# Patient Record
Sex: Female | Born: 1980 | Race: Asian | Hispanic: No | Marital: Married | Smoking: Never smoker
Health system: Southern US, Community
[De-identification: ages and names within clinical notes are randomized; demographics above are authoritative.]

## PROBLEM LIST (undated history)

## (undated) DIAGNOSIS — I729 Aneurysm of unspecified site: Secondary | ICD-10-CM

## (undated) DIAGNOSIS — E785 Hyperlipidemia, unspecified: Secondary | ICD-10-CM

## (undated) DIAGNOSIS — I10 Essential (primary) hypertension: Secondary | ICD-10-CM

## (undated) HISTORY — DX: Hyperlipidemia, unspecified: E78.5

## (undated) HISTORY — DX: Essential (primary) hypertension: I10

---

## 2017-01-07 LAB — HM PAP SMEAR: HM Pap smear: NEGATIVE

## 2019-02-01 ENCOUNTER — Encounter: Payer: Self-pay | Admitting: Physician Assistant

## 2019-02-01 ENCOUNTER — Ambulatory Visit (INDEPENDENT_AMBULATORY_CARE_PROVIDER_SITE_OTHER): Payer: Commercial Managed Care - PPO | Admitting: Physician Assistant

## 2019-02-01 VITALS — BP 130/80 | HR 96 | Temp 97.7°F | Ht 61.0 in | Wt 166.0 lb

## 2019-02-01 DIAGNOSIS — Z3169 Encounter for other general counseling and advice on procreation: Secondary | ICD-10-CM | POA: Diagnosis not present

## 2019-02-01 DIAGNOSIS — E785 Hyperlipidemia, unspecified: Secondary | ICD-10-CM | POA: Insufficient documentation

## 2019-02-01 DIAGNOSIS — E1169 Type 2 diabetes mellitus with other specified complication: Secondary | ICD-10-CM | POA: Insufficient documentation

## 2019-02-01 DIAGNOSIS — I152 Hypertension secondary to endocrine disorders: Secondary | ICD-10-CM | POA: Insufficient documentation

## 2019-02-01 DIAGNOSIS — I10 Essential (primary) hypertension: Secondary | ICD-10-CM | POA: Insufficient documentation

## 2019-02-01 MED ORDER — LABETALOL HCL 100 MG PO TABS: 100.0000 mg | ORAL_TABLET | Freq: Two times a day (BID) | ORAL | 0 refills | Status: DC

## 2019-02-01 NOTE — Progress Notes (Signed)
Patient: Anna Ortega, Female    DOB: [birth year], 38 y.o.   MRN: [id number] Visit Date: 02/01/2019  Today's Provider: Trinna Post, PA-C   Chief Complaint  Patient presents with  . Establish Care  . Virtual Visit   Subjective:   .  Establish Care: Anna Ortega is a 38 y.o. female who presents today to establish care.  Patient came to [place] on October 2019 from the [place], [place]. 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 She married her husband who lives here in August 2018. She does not have any children currently but reports she is planning on getting pregnant.   Patient also has a history of HTN and Hyperlipidemia takes Amlodipine 5 mg QD, coreg 25 mg BID, and norvasc 5 mg daily. She also takes Lipitor 40 mg daily for reported high triglycerides.   Patient reports that she had a pap done in the [place] 12/2016-Reports that it was normal and no HPV was performed. She will be able to send this through mychart.  Influenza Vaccine:07/30/2018 in the [place]. Got Tdap and MMR when coming to this country.  Does not drink, does not smoke. Not using drugs.  Was laid off from job due to coronavirus.  -----------------------------------------------------------------   Review of Systems  HENT: Negative.   Eyes: Negative for visual disturbance.  Cardiovascular: Negative.  Negative for chest pain, palpitations and leg swelling.  Neurological: Negative for headaches.    Social History      She  reports that she has never smoked. She has never used smokeless tobacco. She reports that she does not drink alcohol or use drugs.       Social History   Socioeconomic History  . Marital status: Married    Spouse name: Not on file  . Number of children: Not on file  . Years of education: Not on file  . Highest education level: Not on file  Occupational History  . Not on file  Social Needs  . Financial resource strain: Not on file  . Food insecurity:    Worry: Not  on file    Inability: Not on file  . Transportation needs:    Medical: Not on file    Non-medical: Not on file  Tobacco Use  . Smoking status: Never Smoker  . Smokeless tobacco: Never Used  Substance and Sexual Activity  . Alcohol use: Never    Frequency: Never  . Drug use: Never  . Sexual activity: Yes  Lifestyle  . Physical activity:    Days per week: Not on file    Minutes per session: Not on file  . Stress: Not on file  Relationships  . Social connections:    Talks on phone: Not on file    Gets together: Not on file    Attends religious service: Not on file    Active member of club or organization: Not on file    Attends meetings of clubs or organizations: Not on file    Relationship status: Not on file  Other Topics Concern  . Not on file  Social History Narrative  . Not on file    Past Medical History:  Diagnosis Date  . Hyperlipidemia   . Hypertension      Patient Active Problem List   Diagnosis Date Noted  . Hypertension 02/01/2019  . Hyperlipidemia 02/01/2019    History reviewed. No pertinent surgical history.  Family History        Family Status  Relation Name Status  .  Mother  Alive  . Father  Alive        Her family history includes Glaucoma in her mother; Hypertension in her father and mother; Thyroid disease in her father.      No Known Allergies   Current Outpatient Medications:  .  labetalol (NORMODYNE) 100 MG tablet, Take 1 tablet (100 mg total) by mouth 2 (two) times daily., Disp: 180 tablet, Rfl: 0   Patient Care Team: Paulene Floor as PCP - General (Physician Assistant)    Objective:    Vitals: BP 130/80 (BP Location: Right Arm, Patient Position: Standing, Cuff Size: Normal) Comment: 147/89 with automatic machine  Pulse 96   Temp 97.7 F (36.5 C) (Oral)   Wt 166 lb (75.3 kg)   LMP 01/08/2019    Vitals:   02/01/19 0914  BP: 130/80  Pulse: 96  Temp: 97.7 F (36.5 C)  TempSrc: Oral  Weight: 166 lb (75.3 kg)      Physical Exam Constitutional:      Appearance: Normal appearance.  Pulmonary:     Effort: Pulmonary effort is normal. No respiratory distress.  Neurological:     Mental Status: She is alert.  Psychiatric:        Mood and Affect: Mood normal.        Behavior: Behavior normal.      Depression Screen PHQ 2/9 Scores 02/01/2019  PHQ - 2 Score 0       Assessment & Plan:     Routine Health Maintenance and Physical Exam  Exercise Activities and Dietary recommendations Goals   None      There is no immunization history on file for this patient.  Health Maintenance  Topic Date Due  . HIV Screening  09/03/1994  . TETANUS/TDAP  09/03/1998  . PAP SMEAR-Modifier  09/03/2000  . INFLUENZA VACCINE  06/07/2018     Discussed health benefits of physical activity, and encouraged her to engage in regular exercise appropriate for her age and condition.    1. Hypertension, unspecified type  Will switch blood pressure medications to labetalol as patient is wanting to become pregnant. Will titrate as needed, she will check her BP ~ 3X/wk and we will see her back in one month.   - labetalol (NORMODYNE) 100 MG tablet; Take 1 tablet (100 mg total) by mouth 2 (two) times daily.  Dispense: 180 tablet; Refill: 0  2. Hyperlipidemia, unspecified hyperlipidemia type  We will discontinue her lipitor as it is not safe during pregnancy. She is quite young to be on this medication especially for only high triglycerides. Will get labwork at follow up.  3. Pre-conception counseling  She is not smoking or using alcohol. Discussed using daily prenatal vitamin.   The entirety of the information documented in the History of Present Illness, Review of Systems and Physical Exam were personally obtained by me. Portions of this information were initially documented by Anna Ortega, CMA and reviewed by me for thoroughness and accuracy.     -------------------------------------------------------------------- Nikki Dom Pollak,acting as a scribe for Trinna Post, PA-C.,have documented all relevant documentation on the behalf of Trinna Post, PA-C,as directed by  Trinna Post, PA-C while in the presence of Trinna Post, PA-C.   Trinna Post, PA-C  [facility]

## 2019-02-01 NOTE — Patient Instructions (Signed)

## 2019-03-05 ENCOUNTER — Other Ambulatory Visit: Payer: Self-pay

## 2019-03-05 ENCOUNTER — Encounter: Payer: Self-pay | Admitting: Physician Assistant

## 2019-03-05 ENCOUNTER — Ambulatory Visit: Payer: Commercial Managed Care - PPO | Admitting: Physician Assistant

## 2019-03-05 VITALS — BP 145/88 | HR 95 | Temp 98.0°F | Resp 16 | Wt 169.0 lb

## 2019-03-05 DIAGNOSIS — Z114 Encounter for screening for human immunodeficiency virus [HIV]: Secondary | ICD-10-CM

## 2019-03-05 DIAGNOSIS — Z130 Encounter for screening for diseases of the blood and blood-forming organs and certain disorders involving the immune mechanism: Secondary | ICD-10-CM | POA: Diagnosis not present

## 2019-03-05 DIAGNOSIS — E785 Hyperlipidemia, unspecified: Secondary | ICD-10-CM

## 2019-03-05 DIAGNOSIS — I10 Essential (primary) hypertension: Secondary | ICD-10-CM

## 2019-03-05 DIAGNOSIS — Z3169 Encounter for other general counseling and advice on procreation: Secondary | ICD-10-CM

## 2019-03-05 NOTE — Progress Notes (Signed)
Patient: Anna Ortega Female    DOB: February 06, 1981   38 y.o.   MRN: [id number] Visit Date: 03/06/2019  Today's Provider: Trey SailorsAdriana M Pollak, PA-C   Chief Complaint  Patient presents with  . Follow-up  . Hypertension   Subjective:     HPI   Hypertension, unspecified type From 02/01/2019-switched blood pressure medications to labetalol 100 mg BID from amlodipine 5 mg daily, losartan 50 mg daily and coreg 25 mg BID as patient is wanting to become pregnant. Reports her blood pressures at home are 120's/80s.  BP Readings from Last 3 Encounters:  03/05/19 (!) 145/88  02/01/19 130/80     Hyperlipidemia, unspecified hyperlipidemia type From 02/01/2019-discontinued her lipitor as it is not safe during pregnancy. Will get labwork at follow up.  Lipid Panel     Component Value Date/Time   CHOL 295 (H) 03/05/2019 1602   TRIG 198 (H) 03/05/2019 1602   HDL 53 03/05/2019 1602   CHOLHDL 5.6 (H) 03/05/2019 1602   LDLCALC 202 (H) 03/05/2019 1602   Trying to Conceive: Patient reports she has been trying to conceive with current partner for six months and has thus far been unsuccessful. Had tried previously but not for continuous time. Partner reports he has low sperm count. Patient has never been pregnant. She is [age] and will be 38 in October 2020.    No Known Allergies   Current Outpatient Medications:  .  labetalol (NORMODYNE) 100 MG tablet, Take 1 tablet (100 mg total) by mouth 2 (two) times daily., Disp: 180 tablet, Rfl: 0  Review of Systems  Constitutional: Negative for appetite change, chills, fatigue and fever.  Respiratory: Negative for chest tightness and shortness of breath.   Cardiovascular: Negative for chest pain and palpitations.  Gastrointestinal: Negative for abdominal pain, nausea and vomiting.  Neurological: Negative for dizziness and weakness.    Social History   Tobacco Use  . Smoking status: Never Smoker  . Smokeless tobacco: Never Used  Substance  Use Topics  . Alcohol use: Never    Frequency: Never      Objective:   BP (!) 145/88 (BP Location: Right Arm, Cuff Size: Large)   Pulse 95   Temp 98 F (36.7 C) (Oral)   Resp 16   Wt 169 lb (76.7 kg)   SpO2 98%   BMI 31.93 kg/m  Vitals:   03/05/19 1514 03/05/19 1530  BP: (!) 158/102 (!) 145/88  Pulse: 95   Resp: 16   Temp: 98 F (36.7 C)   TempSrc: Oral   SpO2: 98%   Weight: 169 lb (76.7 kg)      Physical Exam Constitutional:      Appearance: Normal appearance.  Cardiovascular:     Rate and Rhythm: Normal rate and regular rhythm.     Heart sounds: Normal heart sounds.  Pulmonary:     Effort: Pulmonary effort is normal.     Breath sounds: Normal breath sounds.  Skin:    General: Skin is warm and dry.  Neurological:     Mental Status: She is alert. Mental status is at baseline.  Psychiatric:        Mood and Affect: Mood normal.        Behavior: Behavior normal.         Assessment & Plan    1. Essential hypertension  BP lower on recheck, though still high in clinic. Patient reports her Bps run very normally at home, will keep labetalol at  100 mg BID and she will continue to check BP and call back if any increasing trend >140/90. F/u in 6 months. - Comprehensive Metabolic Panel (CMET) - Lipid Profile - TSH  2. 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 Likely will need to go back on statin in future but as she is TTC, will hold for now.  3. Screening for deficiency anemia  - CBC with Differential  4. Encounter for screening for HIV  - HIV antibody (with reflex)  5. Pre-conception counseling  Patient reports actively trying to get pregnant x 6 months without success. As she is 19, I have offered OBGYN referral as they will likely intervene sooner with fertility options. Patient declines at this point, would like to try for 6 months longer. Let her know she can call at any point and I will place  referral.   The entirety of the information documented in the History of Present Illness, Review of Systems and Physical Exam were personally obtained by me. Portions of this information were initially documented by April M. Hyacinth Meeker, CMA and reviewed by me for thoroughness and accuracy.   F/u 6 mo HTN.       Trey Sailors, PA-C  [facility]

## 2019-03-05 NOTE — Patient Instructions (Signed)
First Trimester of Pregnancy  The first trimester of pregnancy is from week 1 until the end of week 13 (months 1 through 3). During this time, your baby will begin to develop inside you. At 6-8 weeks, the eyes and face are formed, and the heartbeat can be seen on ultrasound. At the end of 12 weeks, all the baby's organs are formed. Prenatal care is all the medical care you receive before the birth of your baby. Make sure you get good prenatal care and follow all of your doctor's instructions. Follow these instructions at home: Medicines  Take over-the-counter and prescription medicines only as told by your doctor. Some medicines are safe and some medicines are not safe during pregnancy.  Take a prenatal vitamin that contains at least 600 micrograms (mcg) of folic acid.  If you have trouble pooping (constipation), take medicine that will make your stool soft (stool softener) if your doctor approves. Eating and drinking   Eat regular, healthy meals.  Your doctor will tell you the amount of weight gain that is right for you.  Avoid raw meat and uncooked cheese.  If you feel sick to your stomach (nauseous) or throw up (vomit): ? Eat 4 or 5 small meals a day instead of 3 large meals. ? Try eating a few soda crackers. ? Drink liquids between meals instead of during meals.  To prevent constipation: ? Eat foods that are high in fiber, like fresh fruits and vegetables, whole grains, and beans. ? Drink enough fluids to keep your pee (urine) clear or pale yellow. Activity  Exercise only as told by your doctor. Stop exercising if you have cramps or pain in your lower belly (abdomen) or low back.  Do not exercise if it is too hot, too humid, or if you are in a place of great height (high altitude).  Try to avoid standing for long periods of time. Move your legs often if you must stand in one place for a long time.  Avoid heavy lifting.  Wear low-heeled shoes. Sit and stand up straight.   You can have sex unless your doctor tells you not to. Relieving pain and discomfort  Wear a good support bra if your breasts are sore.  Take warm water baths (sitz baths) to soothe pain or discomfort caused by hemorrhoids. Use hemorrhoid cream if your doctor says it is okay.  Rest with your legs raised if you have leg cramps or low back pain.  If you have puffy, bulging veins (varicose veins) in your legs: ? Wear support hose or compression stockings as told by your doctor. ? Raise (elevate) your feet for 15 minutes, 3-4 times a day. ? Limit salt in your food. Prenatal care  Schedule your prenatal visits by the twelfth week of pregnancy.  Write down your questions. Take them to your prenatal visits.  Keep all your prenatal visits as told by your doctor. This is important. Safety  Wear your seat belt at all times when driving.  Make a list of emergency phone numbers. The list should include numbers for family, friends, the hospital, and police and fire departments. General instructions  Ask your doctor for a referral to a local prenatal class. Begin classes no later than at the start of month 6 of your pregnancy.  Ask for help if you need counseling or if you need help with nutrition. Your doctor can give you advice or tell you where to go for help.  Do not use hot tubs, steam   rooms, or saunas.  Do not douche or use tampons or scented sanitary pads.  Do not cross your legs for long periods of time.  Avoid all herbs and alcohol. Avoid drugs that are not approved by your doctor.  Do not use any tobacco products, including cigarettes, chewing tobacco, and electronic cigarettes. If you need help quitting, ask your doctor. You may get counseling or other support to help you quit.  Avoid cat litter boxes and soil used by cats. These carry germs that can cause birth defects in the baby and can cause a loss of your baby (miscarriage) or stillbirth.  Visit your dentist. At home,  brush your teeth with a soft toothbrush. Be gentle when you floss. Contact a doctor if:  You are dizzy.  You have mild cramps or pressure in your lower belly.  You have a nagging pain in your belly area.  You continue to feel sick to your stomach, you throw up, or you have watery poop (diarrhea).  You have a bad smelling fluid coming from your vagina.  You have pain when you pee (urinate).  You have increased puffiness (swelling) in your face, hands, legs, or ankles. Get help right away if:  You have a fever.  You are leaking fluid from your vagina.  You have spotting or bleeding from your vagina.  You have very bad belly cramping or pain.  You gain or lose weight rapidly.  You throw up blood. It may look like coffee grounds.  You are around people who have German measles, fifth disease, or chickenpox.  You have a very bad headache.  You have shortness of breath.  You have any kind of trauma, such as from a fall or a car accident. Summary  The first trimester of pregnancy is from week 1 until the end of week 13 (months 1 through 3).  To take care of yourself and your unborn baby, you will need to eat healthy meals, take medicines only if your doctor tells you to do so, and do activities that are safe for you and your baby.  Keep all follow-up visits as told by your doctor. This is important as your doctor will have to ensure that your baby is healthy and growing well. This information is not intended to replace advice given to you by your health care provider. Make sure you discuss any questions you have with your health care provider. Document Released: 04/11/2008 Document Revised: 11/01/2016 Document Reviewed: 11/01/2016 Elsevier Interactive Patient Education  2019 Elsevier Inc.  

## 2019-03-06 ENCOUNTER — Telehealth: Payer: Self-pay

## 2019-03-06 LAB — COMPREHENSIVE METABOLIC PANEL
ALT: 20 IU/L (ref 0–32)
AST: 15 IU/L (ref 0–40)
Albumin/Globulin Ratio: 1.7 (ref 1.2–2.2)
Albumin: 5 g/dL — ABNORMAL HIGH (ref 3.8–4.8)
Alkaline Phosphatase: 75 IU/L (ref 39–117)
BUN/Creatinine Ratio: 11 (ref 9–23)
BUN: 10 mg/dL (ref 6–20)
Bilirubin Total: 0.7 mg/dL (ref 0.0–1.2)
CO2: 21 mmol/L (ref 20–29)
Calcium: 10.2 mg/dL (ref 8.7–10.2)
Chloride: 100 mmol/L (ref 96–106)
Creatinine, Ser: 0.91 mg/dL (ref 0.57–1.00)
GFR calc Af Amer: 92 mL/min/{1.73_m2} (ref >59)
GFR calc non Af Amer: 80 mL/min/{1.73_m2} (ref >59)
Globulin, Total: 3 g/dL (ref 1.5–4.5)
Glucose: 91 mg/dL (ref 65–99)
Potassium: 4.1 mmol/L (ref 3.5–5.2)
Sodium: 137 mmol/L (ref 134–144)
Total Protein: 8 g/dL (ref 6.0–8.5)

## 2019-03-06 LAB — CBC WITH DIFFERENTIAL/PLATELET
Basophils Absolute: 0.1 10*3/uL (ref 0.0–0.2)
Basos: 1 %
EOS (ABSOLUTE): 0.2 10*3/uL (ref 0.0–0.4)
Eos: 2 %
Hematocrit: 40.7 % (ref 34.0–46.6)
Hemoglobin: 13.8 g/dL (ref 11.1–15.9)
Immature Grans (Abs): 0 10*3/uL (ref 0.0–0.1)
Immature Granulocytes: 0 %
Lymphocytes Absolute: 3.9 10*3/uL — ABNORMAL HIGH (ref 0.7–3.1)
Lymphs: 38 %
MCH: 29.5 pg (ref 26.6–33.0)
MCHC: 33.9 g/dL (ref 31.5–35.7)
MCV: 87 fL (ref 79–97)
Monocytes Absolute: 1 10*3/uL — ABNORMAL HIGH (ref 0.1–0.9)
Monocytes: 10 %
Neutrophils Absolute: 5.1 10*3/uL (ref 1.4–7.0)
Neutrophils: 49 %
Platelets: 341 10*3/uL (ref 150–450)
RBC: 4.68 x10E6/uL (ref 3.77–5.28)
RDW: 12.8 % (ref 11.7–15.4)
WBC: 10.3 10*3/uL (ref 3.4–10.8)

## 2019-03-06 LAB — LIPID PANEL
Chol/HDL Ratio: 5.6 ratio — ABNORMAL HIGH (ref 0.0–4.4)
Cholesterol, Total: 295 mg/dL — ABNORMAL HIGH (ref 100–199)
HDL: 53 mg/dL (ref >39)
LDL Calculated: 202 mg/dL — ABNORMAL HIGH (ref 0–99)
Triglycerides: 198 mg/dL — ABNORMAL HIGH (ref 0–149)
VLDL Cholesterol Cal: 40 mg/dL (ref 5–40)

## 2019-03-06 LAB — HIV ANTIBODY (ROUTINE TESTING W REFLEX): HIV Screen 4th Generation wRfx: NONREACTIVE

## 2019-03-06 LAB — TSH: TSH: 3.26 u[IU]/mL (ref 0.450–4.500)

## 2019-03-06 NOTE — Telephone Encounter (Signed)
-----   Message from Trey Sailors, [place] sent at 03/06/2019 12:37 PM EDT ----- Cholesterol high, it's likely genetic. She will probably have to go back on her cholesterol medication at some point but definitely not now while she is trying to get pregnant. Remaining labwork normal, no sign of diabetes.

## 2019-03-06 NOTE — Telephone Encounter (Signed)
Patient notified of results.

## 2019-04-25 ENCOUNTER — Other Ambulatory Visit: Payer: Self-pay | Admitting: Physician Assistant

## 2019-04-25 DIAGNOSIS — I10 Essential (primary) hypertension: Secondary | ICD-10-CM

## 2019-07-17 ENCOUNTER — Ambulatory Visit: Payer: Commercial Managed Care - PPO | Admitting: Physician Assistant

## 2019-07-17 ENCOUNTER — Other Ambulatory Visit: Payer: Self-pay

## 2019-07-17 ENCOUNTER — Encounter: Payer: Self-pay | Admitting: Physician Assistant

## 2019-07-17 VITALS — BP 140/82 | HR 80 | Temp 98.2°F | Resp 16 | Wt 167.0 lb

## 2019-07-17 DIAGNOSIS — I10 Essential (primary) hypertension: Secondary | ICD-10-CM

## 2019-07-17 MED ORDER — LABETALOL HCL 200 MG PO TABS: 200.0000 mg | ORAL_TABLET | Freq: Two times a day (BID) | ORAL | 0 refills | Status: DC

## 2019-07-17 NOTE — Progress Notes (Signed)
Patient: Anna Ortega Female    DOB: Jan 19, 1981   38 y.o.   MRN: [id number] Visit Date: 07/18/2019  Today's Provider: Trey SailorsAdriana M Pollak, PA-C   Chief Complaint  Patient presents with  . Hypertension   Subjective:     HPI    Hypertension, follow-up:  BP Readings from Last 3 Encounters:  07/17/19 140/82  03/05/19 (!) 145/88  02/01/19 130/80    She was last seen for hypertension 5 months ago.  BP at that visit was 145/88. Management since that visit includes; no changes. Advised to checked bp at home.She reports good compliance with treatment. She is not having side effects.  She is not exercising. She is not adherent to low salt diet.   Outside blood pressures are 180/100 on occasion and mostly 140's-150's over 80's-90's. She does feel some occasional dizziness when standing up and has had some fatigue. She is not actively preventing pregnancy.  She is experiencing none.  Patient denies chest pain, chest pressure/discomfort, claudication, dyspnea, exertional chest pressure/discomfort, fatigue, irregular heart beat, lower extremity edema, near-syncope, orthopnea, palpitations, paroxysmal nocturnal dyspnea, syncope and tachypnea.   Cardiovascular risk factors include hypertension.  Use of agents associated with hypertension: none.   Wt Readings from Last 3 Encounters:  07/17/19 167 lb (75.8 kg)  03/05/19 169 lb (76.7 kg)  02/01/19 166 lb (75.3 kg)    ------------------------------------------------------------------------    No Known Allergies   Current Outpatient Medications:  .  labetalol (NORMODYNE) 200 MG tablet, Take 1 tablet (200 mg total) by mouth 2 (two) times daily., Disp: 180 tablet, Rfl: 0  Review of Systems  Constitutional: Negative for appetite change, chills, fatigue and fever.  Respiratory: Negative for chest tightness and shortness of breath.   Cardiovascular: Negative for chest pain and palpitations.  Gastrointestinal: Negative for  abdominal pain, nausea and vomiting.  Neurological: Positive for dizziness (when standing). Negative for weakness.    Social History   Tobacco Use  . Smoking status: Never Smoker  . Smokeless tobacco: Never Used  Substance Use Topics  . Alcohol use: Never    Frequency: Never      Objective:   BP 140/82 (BP Location: Left Arm, Cuff Size: Large)   Pulse 80   Temp 98.2 F (36.8 C) (Temporal)   Resp 16   Wt 167 lb (75.8 kg)   SpO2 99% Comment: room air  BMI 31.55 kg/m  Vitals:   07/17/19 1521 07/17/19 1525 07/17/19 1532 07/17/19 1533  BP: (!) 150/100 (!) 142/90 139/87 140/82  Pulse: 80     Resp: 16     Temp: 98.2 F (36.8 C)     TempSrc: Temporal     SpO2: 99%     Weight: 167 lb (75.8 kg)     Body mass index is 31.55 kg/m.   Physical Exam Constitutional:      Appearance: Normal appearance.  Cardiovascular:     Rate and Rhythm: Normal rate and regular rhythm.     Heart sounds: Normal heart sounds.  Pulmonary:     Effort: Pulmonary effort is normal.     Breath sounds: Normal breath sounds.  Skin:    General: Skin is warm and dry.  Neurological:     Mental Status: She is alert and oriented to person, place, and time. Mental status is at baseline.  Psychiatric:        Mood and Affect: Mood normal.        Behavior: Behavior normal.  No results found for any visits on 07/17/19.     Assessment & Plan    1. Hypertension, unspecified type   We will increase medication as below. Follow up one month.  - labetalol (NORMODYNE) 200 MG tablet; Take 1 tablet (200 mg total) by mouth 2 (two) times daily.  Dispense: 180 tablet; Refill: 0  The entirety of the information documented in the History of Present Illness, Review of Systems and Physical Exam were personally obtained by me. Portions of this information were initially documented by Ashley Royalty, CMA and reviewed by me for thoroughness and accuracy.   F/u 1 month    Trinna Post, PA-C  [facility]

## 2019-07-17 NOTE — Patient Instructions (Signed)

## 2019-08-14 ENCOUNTER — Telehealth: Payer: Self-pay | Admitting: Physician Assistant

## 2019-08-21 ENCOUNTER — Telehealth (INDEPENDENT_AMBULATORY_CARE_PROVIDER_SITE_OTHER): Payer: Commercial Managed Care - PPO | Admitting: Physician Assistant

## 2019-08-21 DIAGNOSIS — I10 Essential (primary) hypertension: Secondary | ICD-10-CM

## 2019-08-21 DIAGNOSIS — J019 Acute sinusitis, unspecified: Secondary | ICD-10-CM

## 2019-08-21 MED ORDER — FEXOFENADINE HCL 180 MG PO TABS: 180.0000 mg | ORAL_TABLET | Freq: Every day | ORAL | 0 refills | Status: DC

## 2019-08-21 MED ORDER — NIFEDIPINE ER OSMOTIC RELEASE 30 MG PO TB24: 30.0000 mg | ORAL_TABLET | Freq: Every day | ORAL | 0 refills | Status: DC

## 2019-08-21 MED ORDER — FLUTICASONE PROPIONATE 50 MCG/ACT NA SUSP: 2.0000 | Freq: Every day | NASAL | 6 refills | Status: DC

## 2019-08-21 NOTE — Patient Instructions (Signed)

## 2019-08-21 NOTE — Progress Notes (Signed)
       Patient: Anna Ortega Female    DOB: November 06, 1981   38 y.o.   MRN: [id number] Visit Date: 08/21/2019  Today's Provider: Trinna Post, PA-C   Chief Complaint  Patient presents with  . Follow-up  . Hypertension   Subjective:    Virtual Visit via Video Note  I connected with Anna Ortega on 08/21/19 at  4:00 PM EDT by a video enabled telemedicine application and verified that I am speaking with the correct person using two identifiers.  Location: Patient: Home Provider: Office    HPI  Hypertension, unspecified type From 07/17/2019-Increased labetalol to 200 MG bid. Advised to follow up in one month. She does feel dizzy when she stands up. She reports her BP is better controlled. She is not actively preventing pregnancy but she is also not actively trying either.    No Known Allergies   Current Outpatient Medications:  .  fexofenadine (ALLEGRA ALLERGY) 180 MG tablet, Take 1 tablet (180 mg total) by mouth daily., Disp: 90 tablet, Rfl: 0 .  fluticasone (FLONASE) 50 MCG/ACT nasal spray, Place 2 sprays into both nostrils daily., Disp: 16 g, Rfl: 6 .  labetalol (NORMODYNE) 200 MG tablet, Take 1 tablet (200 mg total) by mouth 2 (two) times daily., Disp: 180 tablet, Rfl: 0 .  NIFEdipine (PROCARDIA-XL/NIFEDICAL-XL) 30 MG 24 hr tablet, Take 1 tablet (30 mg total) by mouth daily., Disp: 90 tablet, Rfl: 0  Review of Systems  Constitutional: Negative for appetite change, chills, fatigue and fever.  Respiratory: Negative for chest tightness and shortness of breath.   Cardiovascular: Negative for chest pain and palpitations.  Gastrointestinal: Negative for abdominal pain, nausea and vomiting.  Neurological: Negative for dizziness and weakness.    Social History   Tobacco Use  . Smoking status: Never Smoker  . Smokeless tobacco: Never Used  Substance Use Topics  . Alcohol use: Never    Frequency: Never      Objective:   There were no vitals taken for this  visit. There were no vitals filed for this visit.There is no height or weight on file to calculate BMI.   Physical Exam   No results found for any visits on 08/21/19.     Assessment & Plan    1. Essential hypertension  For one week, take 100 mg labetalol twice a day. Then can stop medication completely. Then start 30 mg nifedipine daily.   2. Sinusitis  Can take daily Allegra and flonase.   I discussed the limitations of evaluation and management by telemedicine and the availability of in person appointments. The patient expressed understanding and agreed to proceed.   I discussed the assessment and treatment plan with the patient. The patient was provided an opportunity to ask questions and all were answered. The patient agreed with the plan and demonstrated an understanding of the instructions.   The patient was advised to call back or seek an in-person evaluation if the symptoms worsen or if the condition fails to improve as anticipated.  F/u 1 - 2 months for HTN virtually    Trinna Post, PA-C  [facility]

## 2019-08-23 NOTE — Progress Notes (Signed)
lmtcb

## 2019-08-29 NOTE — Progress Notes (Signed)
lmtcb for appt

## 2019-09-10 ENCOUNTER — Telehealth: Payer: Self-pay

## 2019-09-10 NOTE — Telephone Encounter (Signed)
Pt stated that she has been taking NIFEdipine (PROCARDIA-XL/NIFEDICAL-XL) 30 MG 24 hr tablet as directed and she has been getting headaches. Pt stated she thinks the headaches are caused by the medication. Pt is requesting call back to discuss if she should change the medication or what she can do. Please advise. Thanks TNP

## 2019-09-10 NOTE — Telephone Encounter (Signed)
Would start off at 100 mg BID x 1 week and then increase to 200 mg BID. Thanks.

## 2019-09-10 NOTE — Telephone Encounter (Signed)
Yes it's possible. I had tried this medication and labetalol because patient was actively trying to get pregnant and these are the safest medications in pregnancy. If she is not able to tolerate this medication, we can try restarting her old medications with precautions to discontinue immediately if she finds she is pregnant.

## 2019-09-10 NOTE — Telephone Encounter (Signed)
Patient has been advised. KW 

## 2019-09-10 NOTE — Telephone Encounter (Signed)
[id number] 

## 2019-09-10 NOTE — Telephone Encounter (Signed)
Patient calling back and advised as directed below. She wants to go back to Labetalol 200mg . She report that she still has some left at least for 2 months.

## 2019-10-25 ENCOUNTER — Other Ambulatory Visit: Payer: Self-pay | Admitting: Physician Assistant

## 2019-10-25 DIAGNOSIS — I10 Essential (primary) hypertension: Secondary | ICD-10-CM

## 2019-11-14 ENCOUNTER — Other Ambulatory Visit: Payer: Self-pay | Admitting: Physician Assistant

## 2019-11-14 DIAGNOSIS — I10 Essential (primary) hypertension: Secondary | ICD-10-CM

## 2019-11-21 ENCOUNTER — Ambulatory Visit: Payer: Self-pay | Admitting: Physician Assistant

## 2019-11-22 ENCOUNTER — Ambulatory Visit: Payer: Self-pay | Admitting: Physician Assistant

## 2019-12-03 ENCOUNTER — Ambulatory Visit: Payer: Self-pay | Admitting: Physician Assistant

## 2019-12-10 ENCOUNTER — Encounter: Payer: Self-pay | Admitting: Physician Assistant

## 2019-12-10 ENCOUNTER — Other Ambulatory Visit: Payer: Self-pay

## 2019-12-10 ENCOUNTER — Ambulatory Visit: Payer: Commercial Managed Care - PPO | Admitting: Physician Assistant

## 2019-12-10 VITALS — BP 140/92 | HR 91 | Temp 96.8°F | Wt 169.4 lb

## 2019-12-10 DIAGNOSIS — I10 Essential (primary) hypertension: Secondary | ICD-10-CM

## 2019-12-10 DIAGNOSIS — Z319 Encounter for procreative management, unspecified: Secondary | ICD-10-CM | POA: Diagnosis not present

## 2019-12-10 MED ORDER — LABETALOL HCL 200 MG PO TABS: 200.0000 mg | ORAL_TABLET | Freq: Two times a day (BID) | ORAL | 0 refills | Status: DC

## 2019-12-10 NOTE — Progress Notes (Signed)
Patient: Anna Ortega Female    DOB: 29-Sep-1981   39 y.o.   MRN: [id number] Visit Date: 12/11/2019  Today's Provider: Trinna Post, PA-C   Chief Complaint  Patient presents with  . Hypertension   Subjective:     HPI  Hypertension, follow-up:  BP Readings from Last 3 Encounters:  12/10/19 (!) 140/92  07/17/19 140/82  03/05/19 (!) 145/88    She was last seen for hypertension 3 months ago.  BP at that visit was not checked, due to having a virtual visit. Management changes since that visit include: starting nifedipine 30 mg daily but this caused intolerable headaches. Patient instructed to titrate back to 200 mg labetalol BID. Currently patient is taking 100 mg labetalol BID. She is actively trying to get pregnant and would like referral to fertility center.  She reports good compliance with treatment. She is not having side effects.  She is exercising. She is adherent to low salt diet.   Outside blood pressures are arranges around 140's/90's. She is experiencing none.  Patient denies chest pain, chest pressure/discomfort, exertional chest pressure/discomfort, fatigue, irregular heart beat, lower extremity edema and palpitations.   Cardiovascular risk factors include hypertension.  Use of agents associated with hypertension: none.     Weight trend: stable Wt Readings from Last 3 Encounters:  12/10/19 169 lb 6.4 oz (76.8 kg)  07/17/19 167 lb (75.8 kg)  03/05/19 169 lb (76.7 kg)    Current diet: well balanced  ------------------------------------------------------------------------     No Known Allergies   Current Outpatient Medications:  .  fexofenadine (ALLEGRA ALLERGY) 180 MG tablet, Take 1 tablet (180 mg total) by mouth daily., Disp: 90 tablet, Rfl: 0 .  fluticasone (FLONASE) 50 MCG/ACT nasal spray, Place 2 sprays into both nostrils daily., Disp: 16 g, Rfl: 6 .  NIFEdipine (PROCARDIA-XL/NIFEDICAL-XL) 30 MG 24 hr tablet, TAKE 1 TABLET BY MOUTH  EVERY DAY, Disp: 90 tablet, Rfl: 0 .  labetalol (NORMODYNE) 200 MG tablet, Take 1 tablet (200 mg total) by mouth 2 (two) times daily., Disp: 180 tablet, Rfl: 0  Review of Systems  Social History   Tobacco Use  . Smoking status: Never Smoker  . Smokeless tobacco: Never Used  Substance Use Topics  . Alcohol use: Never      Objective:   BP (!) 140/92 (BP Location: Left Arm, Patient Position: Sitting, Cuff Size: Normal)   Pulse 91   Temp (!) 96.8 F (36 C) (Temporal)   Wt 169 lb 6.4 oz (76.8 kg)   LMP 11/27/2019 (Exact Date)   BMI 32.01 kg/m  Vitals:   12/10/19 1601  BP: (!) 140/92  Pulse: 91  Temp: (!) 96.8 F (36 C)  TempSrc: Temporal  Weight: 169 lb 6.4 oz (76.8 kg)  Body mass index is 32.01 kg/m.   Physical Exam Constitutional:      Appearance: Normal appearance.  Cardiovascular:     Rate and Rhythm: Normal rate and regular rhythm.     Heart sounds: Normal heart sounds.  Pulmonary:     Effort: Pulmonary effort is normal.     Breath sounds: Normal breath sounds.  Skin:    General: Skin is warm and dry.  Neurological:     Mental Status: She is alert and oriented to person, place, and time. Mental status is at baseline.  Psychiatric:        Mood and Affect: Mood normal.        Behavior: Behavior normal.  No results found for any visits on 12/10/19.     Assessment & Plan    1. Infertility management  - Ambulatory referral to Infertility  2. Hypertension, unspecified type  Will increased to 200 mg BID. She does have some dizziness with this. Caution to transition positions slowly. Expect her next visit will be with fertility center and they can make recommendations on her BP if it is not controlled by then.   - labetalol (NORMODYNE) 200 MG tablet; Take 1 tablet (200 mg total) by mouth 2 (two) times daily.  Dispense: 180 tablet; Refill: 0  The entirety of the information documented in the History of Present Illness, Review of Systems and Physical  Exam were personally obtained by me. Portions of this information were initially documented by [facility] and reviewed by me for thoroughness and accuracy.      Trey Sailors, PA-C  [facility]

## 2019-12-10 NOTE — Patient Instructions (Signed)
TUMS 

## 2020-02-04 LAB — HM PAP SMEAR: HM Pap smear: NEGATIVE

## 2020-02-12 LAB — TSH: TSH: 3.09 (ref 0.41–5.90)

## 2020-02-17 ENCOUNTER — Other Ambulatory Visit: Payer: Self-pay | Admitting: Internal Medicine

## 2020-02-17 DIAGNOSIS — R7989 Other specified abnormal findings of blood chemistry: Secondary | ICD-10-CM

## 2020-03-13 ENCOUNTER — Other Ambulatory Visit: Payer: Self-pay

## 2020-03-13 ENCOUNTER — Ambulatory Visit
Admission: RE | Admit: 2020-03-13 | Discharge: 2020-03-13 | Disposition: A | Discharge status: Home / Self Care | Payer: Commercial Managed Care - PPO | Source: Ambulatory Visit | Attending: Internal Medicine | Admitting: Internal Medicine

## 2020-03-13 DIAGNOSIS — R7989 Other specified abnormal findings of blood chemistry: Secondary | ICD-10-CM

## 2020-03-13 IMAGING — MR MR HEAD WO/W CM
19 series · 48 of 48 positions shown · IV contrast (multihance)
Comparison: None.

CLINICAL DATA: Elevated prolactin

EXAM:
MRI HEAD WITHOUT AND WITH CONTRAST
TECHNIQUE: Multiplanar, multiecho pulse sequences of the brain and surrounding
structures were obtained without and with intravenous contrast.
CONTRAST:  8mL MULTIHANCE GADOBENATE DIMEGLUMINE 529 MG/ML IV SOLN

[Series 5: T1 · sagittal · 4.0mm · 0.72mm/px · 3 of 28 slices shown (1 of 3)]
[im 1/28]
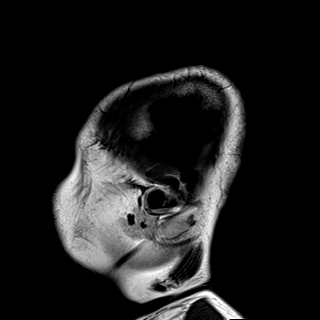
[im 14/28]
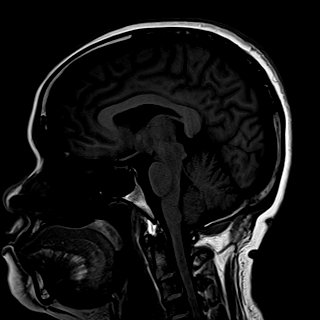
[im 28/28]
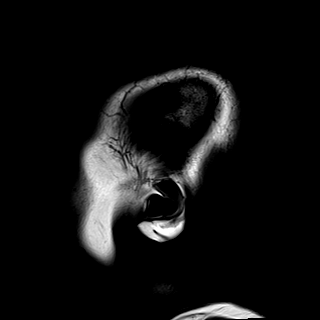

[Series 6: DWI · axial · 3.0mm · 1.44mm/px · z∈[-80,+80]mm · 7 of 84 slices shown (1 of 2)]
[im 1/84]
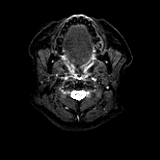
[im 14/84]
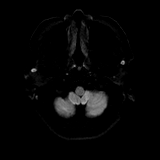
[im 28/84]
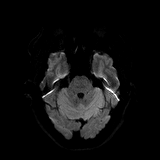
[im 42/84]
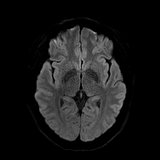
[im 56/84]
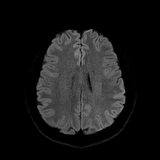
[im 70/84]
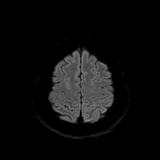
[im 84/84]
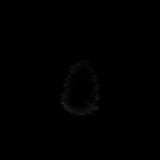

[Series 7: DWI · axial · 3.0mm · 1.44mm/px · z∈[-80,+80]mm · 3 of 42 slices shown (2 of 2)]
[im 1/42]
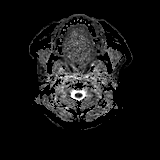
[im 21/42]
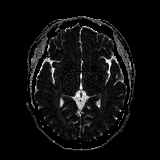
[im 42/42]
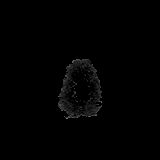

[Series 8: T2 · axial · 4.0mm · 0.36mm/px · z∈[-67,+73]mm · 2 of 28 slices shown]
[im 1/28]
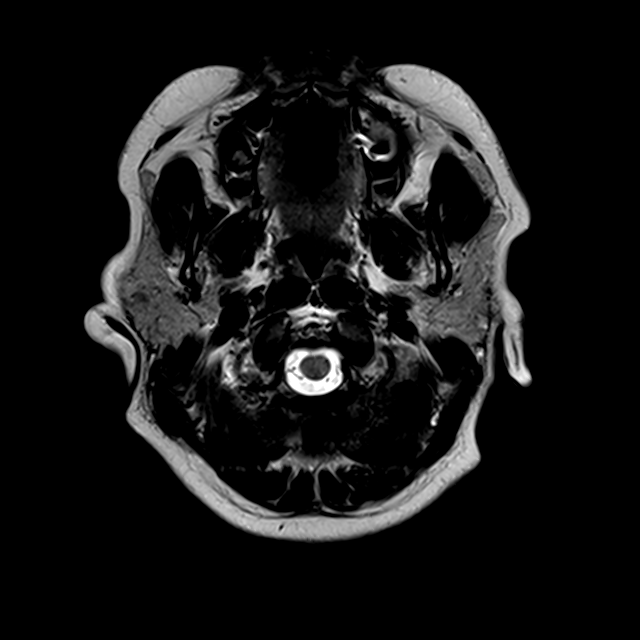
[im 28/28]
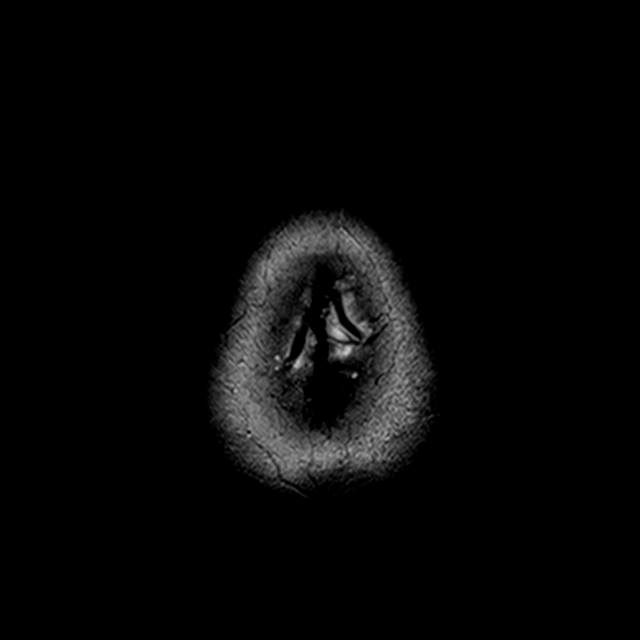

[Series 10: swi_images · axial · 2.0mm · 0.90mm/px · z∈[-66,+76]mm · 6 of 72 slices shown]
[im 1/72]
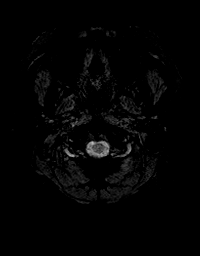
[im 15/72]
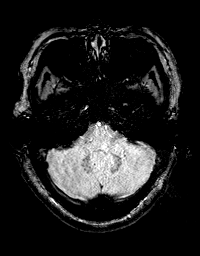
[im 29/72]
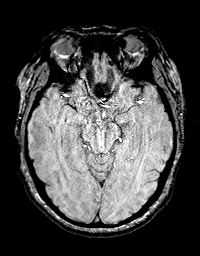
[im 43/72]
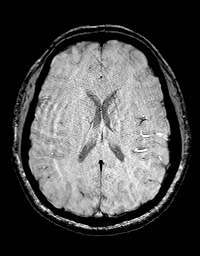
[im 57/72]
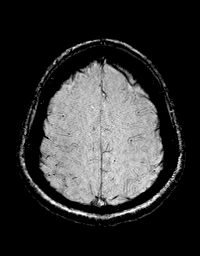
[im 72/72]
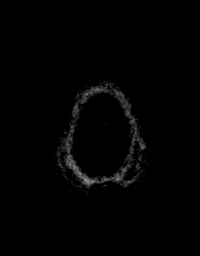

[Series 11: FLAIR · axial · 3.0mm · 0.72mm/px · z∈[-65,+72]mm · 2 of 24 slices shown]
[im 1/24]
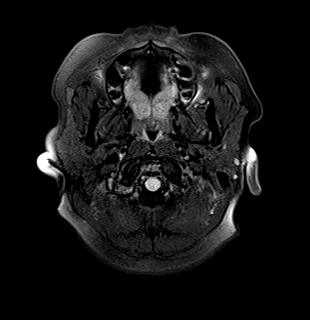
[im 24/24]
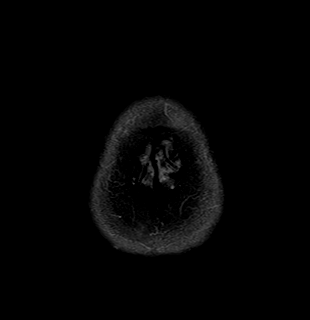

[Series 12: T1 · sagittal · 3.0mm · 0.42mm/px · 1 of 13 slices shown (2 of 3)]
[im 1/13]
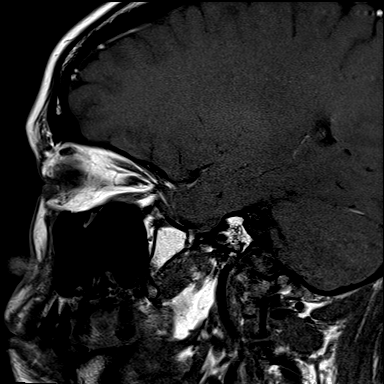

[Series 13: T1 · coronal · 3.0mm · 0.42mm/px · 1 of 10 slices shown (3 of 3)]
[im 1/10]
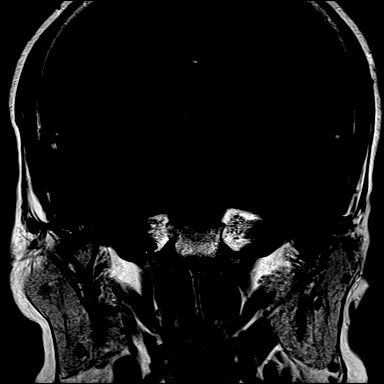

[Series 14: t1_vibe_cor_pre · coronal · 2.0mm · 0.50mm/px · 1 of 12 slices shown]
[im 1/12]
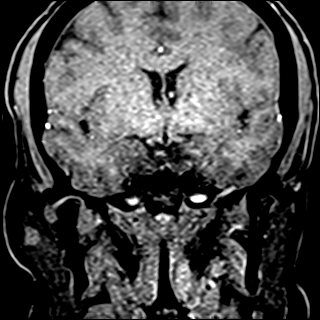

[Series 15: t1_vibe_cor_dynamic post · coronal · 2.0mm · 0.50mm/px · 1 of 12 slices shown (1 of 6)]
[im 1/12]
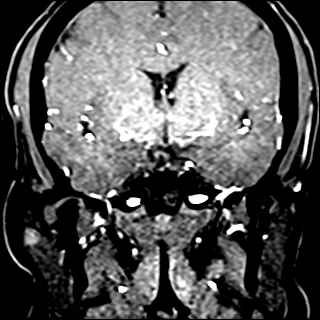

[Series 16: t1_vibe_cor_dynamic post · coronal · 2.0mm · 0.50mm/px · 1 of 12 slices shown (2 of 6)]
[im 1/12]
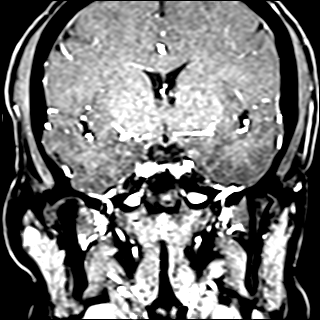

[Series 17: t1_vibe_cor_dynamic post · coronal · 2.0mm · 0.50mm/px · 1 of 12 slices shown (3 of 6)]
[im 1/12]
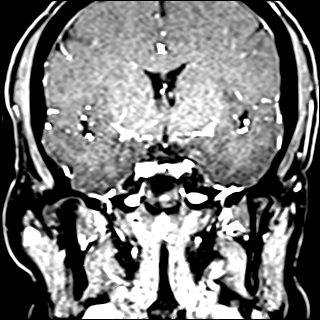

[Series 18: t1_vibe_cor_dynamic post · coronal · 2.0mm · 0.50mm/px · 1 of 12 slices shown (4 of 6)]
[im 1/12]
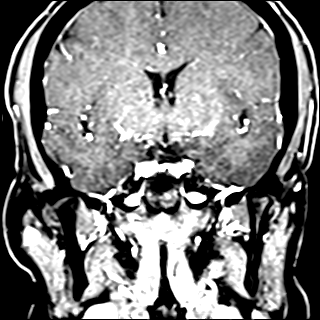

[Series 19: t1_vibe_cor_dynamic post · coronal · 2.0mm · 0.50mm/px · 1 of 12 slices shown (5 of 6)]
[im 1/12]
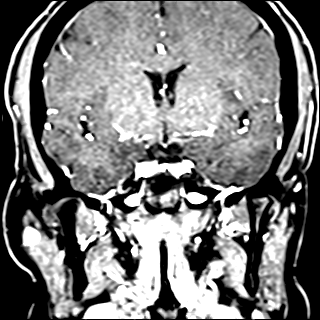

[Series 20: t1_vibe_cor_dynamic post · coronal · 2.0mm · 0.50mm/px · 1 of 12 slices shown (6 of 6)]
[im 1/12]
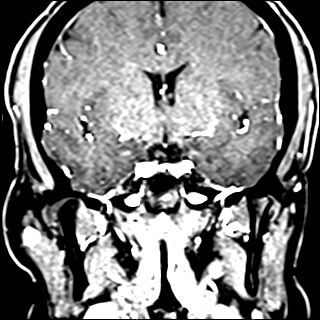

[Series 21: T1 post-contrast · sagittal · 3.0mm · 0.42mm/px · 1 of 13 slices shown (1 of 4)]
[im 1/13]
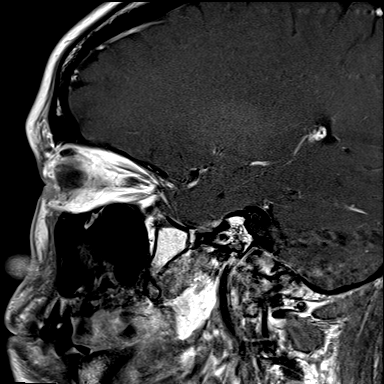

[Series 22: T1 post-contrast · coronal · 3.0mm · 0.42mm/px · 1 of 10 slices shown (2 of 4)]
[im 1/10]
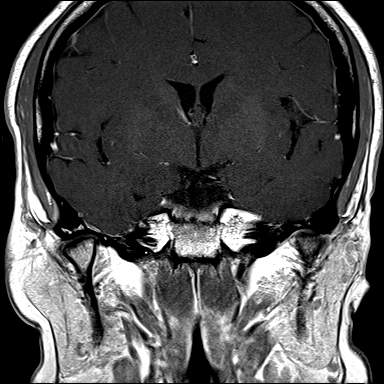

[Series 23: T1 post-contrast · axial · 1.0mm · 0.86mm/px · z∈[-67,+76]mm · 12 of 144 slices shown (3 of 4)]
[im 1/144]
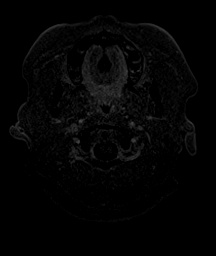
[im 14/144]
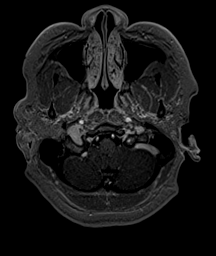
[im 27/144]
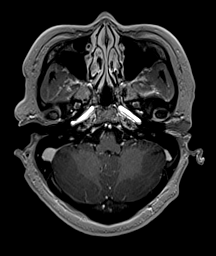
[im 40/144]
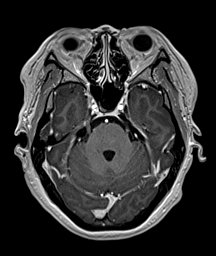
[im 53/144]
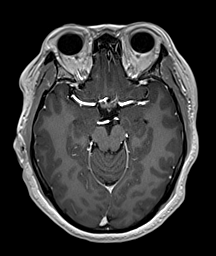
[im 66/144]
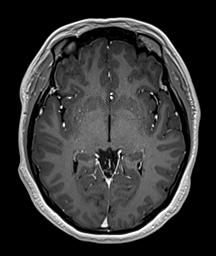
[im 79/144]
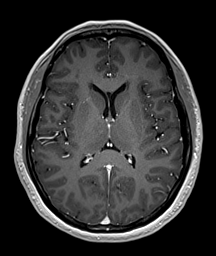
[im 92/144]
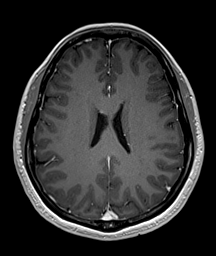
[im 105/144]
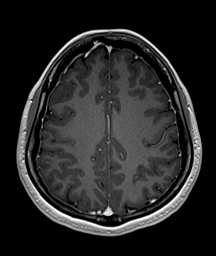
[im 118/144]
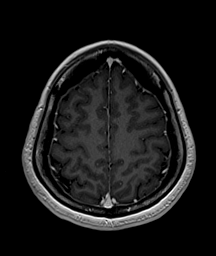
[im 131/144]
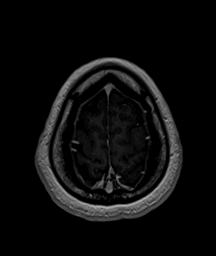
[im 144/144]
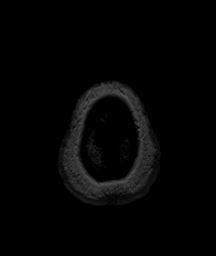

[Series 24: T1 post-contrast · coronal · 4.0mm · 0.47mm/px · 2 of 30 slices shown (4 of 4)]
[im 1/30]
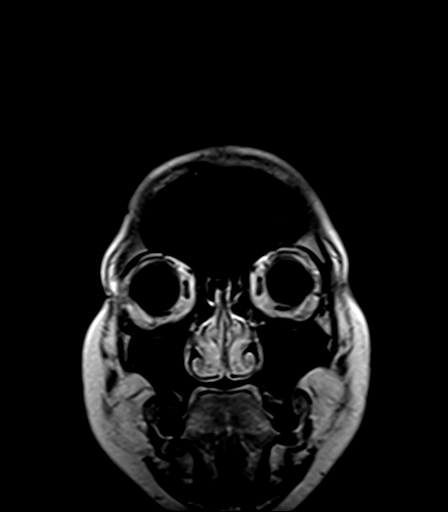
[im 30/30]
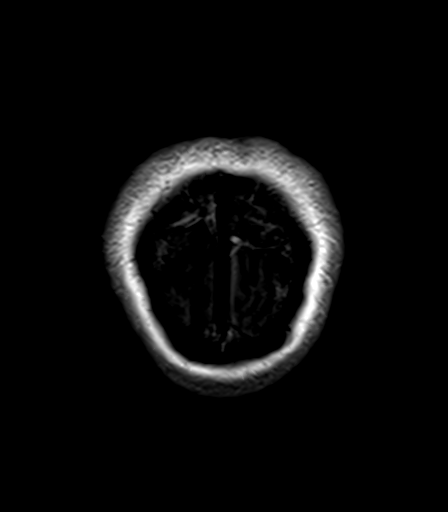

[48 of 48 positions shown; findings below may reference images not displayed]

FINDINGS: Brain: Normal size and shape of the pituitary gland (4 mm
craniocaudal) with homogeneous enhancement. Normal suprasellar
cistern and cavernous sinus region.

Remote white matter insults with mild extent but greater than
expected for age. These can be seen with ischemia, inflammation, or
complicated migraine.

No acute infarct, hemorrhage, hydrocephalus, or collection.

Vascular: On postcontrast imaging there is a superiorly projecting
saccular structure from the paraclinoid right ICA, measuring 2 mm.

Skull and upper cervical spine: Normal marrow signal

Sinuses/Orbits: Negative
IMPRESSION: 1. Normal MRI of the pituitary gland.
2. Probable 2 mm right periophthalmic aneurysm, recommend CTA or MRA
characterization.
3. Mild white matter disease which is nonspecific. History of
hypertension favors premature small vessel disease.

## 2020-03-13 MED ORDER — GADOBENATE DIMEGLUMINE 529 MG/ML IV SOLN
8.0000 mL | Freq: Once | INTRAVENOUS | Status: AC | PRN
  Administered 2020-03-13: 8 mL via INTRAVENOUS

## 2020-03-18 ENCOUNTER — Telehealth: Payer: Self-pay

## 2020-03-18 DIAGNOSIS — I729 Aneurysm of unspecified site: Secondary | ICD-10-CM

## 2020-03-18 NOTE — Telephone Encounter (Signed)
I am able to see the MRI of her brain. There is a possible aneurysm near her right eye and the recommendation is to get a repeat MRI. I would recommend we refer her to neurology to get this further evaluated. If she is agreeable, we can refer to neurology for evaluation of possible aneurysm.

## 2020-03-18 NOTE — Telephone Encounter (Signed)
We received a call from [facility] stating that patient had a MRI of the Pituitary for Prolactin levels (which was ordered by GYN Dr. 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 Darel Hong states she is faxing the report to our office for the PCP to follow up on. [facility] Fax number confirmed with Darel Hong.

## 2020-03-19 LAB — T4, FREE
FSH: 4.1
Free T4: 1.08
LH: 6.2
Prolactin: 105

## 2020-03-19 NOTE — Telephone Encounter (Signed)
Patient was advised that someone will reach out to her for appointment.

## 2020-03-19 NOTE — Telephone Encounter (Signed)
I've placed the referral for neurology. They will order the appropriate image for her.

## 2020-03-19 NOTE — Telephone Encounter (Signed)
Patient was advised and states she will repeat the MRI and the referral can be placed to any neurology office.Please advise

## 2020-03-19 NOTE — Addendum Note (Signed)
Addended by: Trey Sailors on: 03/19/2020 11:49 AM   Modules accepted: Orders

## 2020-05-05 ENCOUNTER — Other Ambulatory Visit: Payer: Self-pay | Admitting: Neurology

## 2020-05-05 DIAGNOSIS — I671 Cerebral aneurysm, nonruptured: Secondary | ICD-10-CM

## 2020-05-21 ENCOUNTER — Ambulatory Visit: Payer: Commercial Managed Care - PPO

## 2020-06-05 ENCOUNTER — Other Ambulatory Visit: Payer: Self-pay | Admitting: Physician Assistant

## 2020-06-05 DIAGNOSIS — I10 Essential (primary) hypertension: Secondary | ICD-10-CM

## 2020-11-17 ENCOUNTER — Other Ambulatory Visit: Payer: Self-pay | Admitting: Physician Assistant

## 2020-11-17 DIAGNOSIS — I10 Essential (primary) hypertension: Secondary | ICD-10-CM

## 2020-11-24 ENCOUNTER — Ambulatory Visit
Admission: RE | Admit: 2020-11-24 | Discharge: 2020-11-24 | Disposition: A | Discharge status: Home / Self Care | Payer: BC Managed Care – PPO | Source: Ambulatory Visit

## 2020-11-24 ENCOUNTER — Other Ambulatory Visit: Payer: Self-pay

## 2020-11-24 VITALS — BP 134/91 | HR 77 | Temp 97.9°F | Resp 17

## 2020-11-24 DIAGNOSIS — Z1152 Encounter for screening for COVID-19: Secondary | ICD-10-CM

## 2020-11-24 HISTORY — DX: Aneurysm of unspecified site: I72.9

## 2020-11-24 NOTE — ED Provider Notes (Signed)
Anna Ortega    CSN: [id number] Arrival date & time: 11/24/20  1048      History   Chief Complaint Chief Complaint  Patient presents with  . Sore Throat    HPI Anna Ortega is a 40 y.o. female.   Patient is a 40 year old female who presents today complaints of loss of taste and smell, scratchy throat, nasal congestion x 6 days. Mother in law tested positive for covid yesterday. Husband also has symptoms. No fever.      Past Medical History:  Diagnosis Date  . Aneurysm (HCC)    above R eye   . Hyperlipidemia   . Hypertension     Patient Active Problem List   Diagnosis Date Noted  . Hypertension 02/01/2019  . Hyperlipidemia 02/01/2019    History reviewed. No pertinent surgical history.  OB History   No obstetric history on file.      Home Medications    Prior to Admission medications   Medication Sig Start Date End Date Taking? Authorizing Provider  MAGNESIUM PO Take by mouth.   Yes [provider]  Vitamin D, Ergocalciferol, (DRISDOL) 1.25 MG (50000 UNIT) CAPS capsule Take 50,000 Units by mouth every 7 (seven) days.   Yes [provider]  labetalol (NORMODYNE) 200 MG tablet TAKE 1 TABLET BY MOUTH TWICE A DAY 11/17/20   Osvaldo Angst M, PA-C  fexofenadine (ALLEGRA ALLERGY) 180 MG tablet Take 1 tablet (180 mg total) by mouth daily. 08/21/19 11/24/20  Trey Sailors, PA-C  fluticasone (FLONASE) 50 MCG/ACT nasal spray Place 2 sprays into both nostrils daily. 08/21/19 11/24/20  Trey Sailors, PA-C  NIFEdipine (PROCARDIA-XL/NIFEDICAL-XL) 30 MG 24 hr tablet TAKE 1 TABLET BY MOUTH EVERY DAY 11/14/19 11/24/20  Trey Sailors, PA-C    Family History Family History  Problem Relation Age of Onset  . Hypertension Mother   . Glaucoma Mother   . Hypertension Father   . Thyroid disease Father     Social History Social History   Tobacco Use  . Smoking status: Never Smoker  . Smokeless tobacco: Never Used  Vaping Use  .  Vaping Use: Never used  Substance Use Topics  . Alcohol use: Never  . Drug use: Never     Allergies   Patient has no known allergies.   Review of Systems Review of Systems   Physical Exam Triage Vital Signs ED Triage Vitals  Enc Vitals Group     BP 11/24/20 1113 (!) 134/91     Pulse Rate 11/24/20 1113 77     Resp 11/24/20 1113 17     Temp 11/24/20 1113 97.9 F (36.6 C)     Temp Source 11/24/20 1113 Oral     SpO2 11/24/20 1113 98 %     Weight --      Height --      Head Circumference --      Peak Flow --      Pain Score 11/24/20 1110 0     Pain Loc --      Pain Edu? --      Excl. in GC? --    No data found.  Updated Vital Signs BP (!) 134/91 (BP Location: Left Arm)   Pulse 77   Temp 97.9 F (36.6 C) (Oral)   Resp 17   LMP 11/09/2020   SpO2 98%   Visual Acuity Right Eye Distance:   Left Eye Distance:   Bilateral Distance:    Right Eye Near:  Left Eye Near:    Bilateral Near:     Physical Exam Vitals and nursing note reviewed.  Constitutional:      General: She is not in acute distress.    Appearance: Normal appearance. She is not ill-appearing, toxic-appearing or diaphoretic.  HENT:     Head: Normocephalic.  Eyes:     Conjunctiva/sclera: Conjunctivae normal.  Pulmonary:     Effort: Pulmonary effort is normal.  Musculoskeletal:        General: Normal range of motion.     Cervical back: Normal range of motion.  Skin:    General: Skin is warm and dry.     Findings: No rash.  Neurological:     Mental Status: She is alert.  Psychiatric:        Mood and Affect: Mood normal.      UC Treatments / Results  Labs (all labs ordered are listed, but only abnormal results are displayed) Labs Reviewed  NOVEL CORONAVIRUS, NAA    EKG   Radiology No results found.  Procedures Procedures (including critical care time)  Medications Ordered in UC Medications - No data to display  Initial Impression / Assessment and Plan / UC Course  I have  reviewed the triage vital signs and the nursing notes.  Pertinent labs & imaging results that were available during my care of the patient were reviewed by me and considered in my medical decision making (see chart for details).     Viral illness with COVID exposure COVID test pending.  Recommend over-the-counter medicines as needed. Follow up as needed for continued or worsening symptoms  Final Clinical Impressions(s) / UC Diagnoses   Final diagnoses:  Encounter for screening for COVID-19     Discharge Instructions     Covid test pending.  You can take OTC medicines as needed  Follow up as needed for continued or worsening symptoms    ED Prescriptions    None     PDMP not reviewed this encounter.   Janace Aris, NP 11/24/20 1224

## 2020-11-24 NOTE — ED Triage Notes (Signed)
Pt reports loss of taste/smell x 6 days.  Reports having cold symptoms for days prior to that.  Mother in law is COVID positive and in same house.

## 2020-11-24 NOTE — Discharge Instructions (Addendum)
Covid test pending.  You can take OTC medicines as needed  Follow up as needed for continued or worsening symptoms

## 2020-11-27 LAB — NOVEL CORONAVIRUS, NAA: SARS-CoV-2, NAA: NOT DETECTED

## 2020-12-01 ENCOUNTER — Other Ambulatory Visit: Payer: Self-pay | Admitting: Physician Assistant

## 2020-12-01 DIAGNOSIS — I10 Essential (primary) hypertension: Secondary | ICD-10-CM

## 2020-12-21 ENCOUNTER — Other Ambulatory Visit: Payer: Self-pay | Admitting: Physician Assistant

## 2020-12-21 DIAGNOSIS — I10 Essential (primary) hypertension: Secondary | ICD-10-CM

## 2020-12-24 ENCOUNTER — Ambulatory Visit (INDEPENDENT_AMBULATORY_CARE_PROVIDER_SITE_OTHER): Payer: BC Managed Care – PPO | Admitting: Physician Assistant

## 2020-12-24 ENCOUNTER — Other Ambulatory Visit: Payer: Self-pay

## 2020-12-24 ENCOUNTER — Encounter: Payer: Self-pay | Admitting: Physician Assistant

## 2020-12-24 VITALS — BP 151/72 | HR 77 | Temp 97.7°F | Ht 59.0 in | Wt 170.4 lb

## 2020-12-24 DIAGNOSIS — E785 Hyperlipidemia, unspecified: Secondary | ICD-10-CM | POA: Diagnosis not present

## 2020-12-24 DIAGNOSIS — I10 Essential (primary) hypertension: Secondary | ICD-10-CM | POA: Diagnosis not present

## 2020-12-24 MED ORDER — CARVEDILOL 12.5 MG PO TABS: 12.5000 mg | ORAL_TABLET | Freq: Two times a day (BID) | ORAL | 1 refills | Status: DC

## 2020-12-24 MED ORDER — AMLODIPINE BESYLATE 5 MG PO TABS: 5.0000 mg | ORAL_TABLET | Freq: Every day | ORAL | 1 refills | Status: DC

## 2020-12-24 MED ORDER — ATORVASTATIN CALCIUM 40 MG PO TABS: 40.0000 mg | ORAL_TABLET | Freq: Every day | ORAL | 3 refills | Status: DC

## 2020-12-24 MED ORDER — LOSARTAN POTASSIUM 50 MG PO TABS: 50.0000 mg | ORAL_TABLET | Freq: Every day | ORAL | 1 refills | Status: DC

## 2020-12-24 NOTE — Progress Notes (Signed)
Established patient visit   Patient: Anna Ortega   DOB: [birth year]   40 y.o. Female  MRN: [id number] Visit Date: 12/24/2020  Today's healthcare provider: Trey Sailors, PA-C   Chief Complaint  Patient presents with  . Hypertension  I,Porsha C McClurkin,acting as a scribe for [employer], PA-C.,have documented all relevant documentation on the behalf of Trey Sailors, PA-C,as directed by  Trey Sailors, PA-C while in the presence of Trey Sailors, PA-C.  Subjective    HPI  Hypertension, follow-up  BP Readings from Last 3 Encounters:  12/24/20 (!) 151/72  11/24/20 (!) 134/91  12/10/19 (!) 140/92   Wt Readings from Last 3 Encounters:  12/24/20 170 lb 6.4 oz (77.3 kg)  12/10/19 169 lb 6.4 oz (76.8 kg)  07/17/19 167 lb (75.8 kg)     She was last seen for hypertension 1 years ago.  BP at that visit was 140/92. Management since that visit includes increased labetalol to 200mg  BID.  She reports good compliance with treatment. She is not having side effects.  She is following a Regular diet. She is exercising. She does not smoke.  Use of agents associated with hypertension: none.   Outside blood pressures are . Symptoms: No chest pain No chest pressure  No palpitations No syncope  No dyspnea No orthopnea  No paroxysmal nocturnal dyspnea No lower extremity edema   Pertinent labs: Lab Results  Component Value Date   CHOL 295 (H) 03/05/2019   HDL 53 03/05/2019   LDLCALC 202 (H) 03/05/2019   TRIG 198 (H) 03/05/2019   CHOLHDL 5.6 (H) 03/05/2019   Lab Results  Component Value Date   NA 137 03/05/2019   K 4.1 03/05/2019   CREATININE 0.91 03/05/2019   GFRNONAA 80 03/05/2019   GFRAA 92 03/05/2019   GLUCOSE 91 03/05/2019     The 10-year ASCVD risk score 03/07/2019 DC Jr., et al., 2013) is: 2.6%   Aneursym - followed by neurology, last MRI 03/2020 which shows:   IMPRESSION: 1. Normal MRI of the pituitary gland. 2. Probable 2 mm right  periophthalmic aneurysm, recommend CTA or MRA characterization. 3. Mild white matter disease which is nonspecific. History of hypertension favors premature small vessel disease.  Lipid/Cholesterol, Follow-up  Last lipid panel Other pertinent labs  Lab Results  Component Value Date   CHOL 295 (H) 03/05/2019   HDL 53 03/05/2019   LDLCALC 202 (H) 03/05/2019   TRIG 198 (H) 03/05/2019   CHOLHDL 5.6 (H) 03/05/2019   Lab Results  Component Value Date   ALT 20 03/05/2019   AST 15 03/05/2019   PLT 341 03/05/2019   TSH 3.09 02/12/2020     She was last seen for this 1 years ago.  Management since that visit includes continue to hold statin as she was trying to get pregnant. She has since been through fertility workup but has decided to stop pursuing and medical intervention regarding fertility.   She reports good compliance with treatment.  Symptoms: No chest pain No chest pressure/discomfort  No dyspnea No lower extremity edema  No numbness or tingling of extremity No orthopnea  No palpitations No paroxysmal nocturnal dyspnea  No speech difficulty No syncope   Current diet: well balanced Current exercise: none  The 10-year ASCVD risk score 04/13/2020 DC Jr., et al., 2013) is: 2.6%  ---------------------------------------------------------------------------------------------------      Medications: Outpatient Medications Prior to Visit  Medication Sig  . labetalol (NORMODYNE) 200 MG tablet  TAKE 1 TABLET BY MOUTH TWICE A DAY  . MAGNESIUM PO Take by mouth. (Patient not taking: Reported on 12/24/2020)  . Vitamin D, Ergocalciferol, (DRISDOL) 1.25 MG (50000 UNIT) CAPS capsule Take 50,000 Units by mouth every 7 (seven) days. (Patient not taking: Reported on 12/24/2020)   No facility-administered medications prior to visit.    Review of Systems  Constitutional: Negative.   Respiratory: Negative.   Hematological: Negative.        Objective    BP (!) 151/72 (BP Location: Left  Arm, Patient Position: Sitting, Cuff Size: Large)   Pulse 77   Temp 97.7 F (36.5 C) (Oral)   Ht 4\' 11"  (1.499 m)   Wt 170 lb 6.4 oz (77.3 kg)   LMP 12/23/2020 (Exact Date)   SpO2 100%   BMI 34.42 kg/m     Physical Exam Constitutional:      Appearance: Normal appearance. She is obese.  Cardiovascular:     Rate and Rhythm: Normal rate and regular rhythm.     Heart sounds: Normal heart sounds.  Pulmonary:     Effort: Pulmonary effort is normal.     Breath sounds: Normal breath sounds.  Skin:    General: Skin is warm and dry.  Neurological:     General: No focal deficit present.     Mental Status: She is alert and oriented to person, place, and time. Mental status is at baseline.  Psychiatric:        Mood and Affect: Mood normal.        Behavior: Behavior normal.       No results found for any visits on 12/24/20.  Assessment & Plan    1. Hyperlipidemia, unspecified hyperlipidemia type  Restart statin as it was initially stopped due to TTC. Not pursuing external intervention for fertility and cholesterol is significantly uncontrolled. If she becomes pregnant we will need to discontinue medication.   - atorvastatin (LIPITOR) 40 MG tablet; Take 1 tablet (40 mg total) by mouth daily.  Dispense: 90 tablet; Refill: 3  2. Hypertension, unspecified type  Resume previous regimen, instructed how to taper off labetalol. Follow up 6 months.   - losartan (COZAAR) 50 MG tablet; Take 1 tablet (50 mg total) by mouth daily.  Dispense: 90 tablet; Refill: 1 - carvedilol (COREG) 12.5 MG tablet; Take 1 tablet (12.5 mg total) by mouth 2 (two) times daily with a meal.  Dispense: 180 tablet; Refill: 1 - amLODipine (NORVASC) 5 MG tablet; Take 1 tablet (5 mg total) by mouth daily.  Dispense: 90 tablet; Refill: 1   No follow-ups on file.      I02/19/22, PA-C, have reviewed all documentation for this visit. The documentation on 12/31/20 for the exam, diagnosis, procedures, and orders  are all accurate and complete.  The entirety of the information documented in the History of Present Illness, Review of Systems and Physical Exam were personally obtained by me. Portions of this information were initially documented by [facility] and reviewed by me for thoroughness and accuracy.     [facility]  [facility] [phone] (phone) [phone] (fax)  [facility]

## 2020-12-24 NOTE — Patient Instructions (Signed)
Decrease labetalol to 200 mg once daily for a week then may stop and restart other blood pressure medicines.

## 2021-06-21 ENCOUNTER — Other Ambulatory Visit: Payer: Self-pay

## 2021-06-21 ENCOUNTER — Telehealth: Payer: Self-pay | Admitting: Physician Assistant

## 2021-06-21 DIAGNOSIS — I10 Essential (primary) hypertension: Secondary | ICD-10-CM

## 2021-06-21 MED ORDER — LOSARTAN POTASSIUM 50 MG PO TABS: 50.0000 mg | ORAL_TABLET | Freq: Every day | ORAL | 0 refills | Status: DC

## 2021-06-21 MED ORDER — AMLODIPINE BESYLATE 5 MG PO TABS: 5.0000 mg | ORAL_TABLET | Freq: Every day | ORAL | 0 refills | Status: DC

## 2021-06-21 MED ORDER — CARVEDILOL 12.5 MG PO TABS: 12.5000 mg | ORAL_TABLET | Freq: Two times a day (BID) | ORAL | 0 refills | Status: DC

## 2021-06-21 NOTE — Telephone Encounter (Signed)
[facility] refills sent in with instruction to be seen

## 2021-06-21 NOTE — Telephone Encounter (Signed)
[facility] faxed refill request for the following medications:  carvedilol (COREG) 12.5 MG tablet   losartan (COZAAR) 50 MG tablet    amLODipine (NORVASC) 5 MG tablet   Please advise.

## 2021-06-22 ENCOUNTER — Ambulatory Visit: Payer: Commercial Managed Care - PPO | Admitting: Family Medicine

## 2021-06-22 ENCOUNTER — Ambulatory Visit: Payer: Commercial Managed Care - PPO | Admitting: Physician Assistant

## 2021-07-19 ENCOUNTER — Other Ambulatory Visit: Payer: Self-pay | Admitting: Family Medicine

## 2021-07-19 DIAGNOSIS — I10 Essential (primary) hypertension: Secondary | ICD-10-CM

## 2021-07-19 NOTE — Telephone Encounter (Signed)
Requested medication (s) are due for refill today- yes  Requested medication (s) are on the active medication list -yes  Future visit scheduled -yes  Last refill: 06/21/21-all  Notes to clinic: Request RF: Courtesy RF with last fill-  Rx has notes- must have appointment-appointment has been scheduled- 11/9- sent for review of request  Requested Prescriptions  Pending Prescriptions Disp Refills   losartan (COZAAR) 50 MG tablet [Pharmacy Med Name: LOSARTAN POTASSIUM 50 MG TAB] 30 tablet 0    Sig: TAKE 1 TABLET BY MOUTH EVERY DAY     Cardiovascular:  Angiotensin Receptor Blockers Failed - 07/19/2021  9:08 AM      Failed - Cr in normal range and within 180 days    Creatinine, Ser  Date Value Ref Range Status  03/05/2019 0.91 0.57 - 1.00 mg/dL Final          Failed - K in normal range and within 180 days    Potassium  Date Value Ref Range Status  03/05/2019 4.1 3.5 - 5.2 mmol/L Final          Failed - Last BP in normal range    BP Readings from Last 1 Encounters:  12/24/20 (!) 151/72          Failed - Valid encounter within last 6 months    Recent Outpatient Visits           6 months ago Hyperlipidemia, unspecified hyperlipidemia type   [facility] [place], Lavella Hammock, PA-C   1 year ago Infertility management   [facility] [place], Lavella Hammock, [place]   1 year ago Essential hypertension   [facility] Os[place]do Angst M, [place]   2 years ago Hypertension, unspecified type   [facility] [place], Lavella Hamm[place], [place]   2 years ago Essential hypertension   [facility] [place], Lavella Hammock, [place]       Future Appointments             In 1 month Jacky Kindle, FNP [facility], [facility]            Passed - Patient is not pregnant       amLODipine (NORVASC) 5 MG tablet [Pharmacy Med Name: AMLODIPINE BESYLATE 5 MG TAB] 30 tablet 0    Sig: Take 1 tablet (5 mg total) by mouth daily.      Cardiovascular:  Calcium Channel Blockers Failed - 07/19/2021  9:08 AM      Failed - Last BP in normal range    BP Readings from Last 1 Encounters:  12/24/20 (!) 151/72          Failed - Valid encounter within last 6 months    Recent Outpatient Visits           6 months ago Hyperlipidemia, unspecified hyperlipidemia type   [facility] [place], Lavella Hammock, [place]   1 year ago Infertility management   [facility] [place], Lavella Hammock, [place]   1 year ago Essential hypertension   [facility] Osvaldo Angst M, [place]   2 years ago Hypertension, unspecified type   [facility] [place], Lavella Hammock, [place]   2 years ago Essential hypertension   [facility] [place], Lavella Hammock, [place]       Future Appointments             In 1 month Jacky Kindle, FNP [facility], [facility]  carvedilol (COREG) 12.5 MG tablet [Pharmacy Med Name: CARVEDILOL 12.5 MG TABLET] 60 tablet 0    Sig: Take 1 tablet (12.5 mg total) by mouth 2 (two) times daily with a meal.     Cardiovascular:  Beta Blockers Failed - 07/19/2021  9:08 AM      Failed - Last BP in normal range    BP Readings from Last 1 Encounters:  12/24/20 (!) 151/72          Failed - Valid encounter within last 6 months    Recent Outpatient Visits           6 months ago Hyperlipidemia, unspecified hyperlipidemia type   [facility] [place], Lavella Hammock, [place]   1 year ago Infertility management   [facility] [place], [place], [place]   1 year ago Essential hypertension   [facility] Osvaldo Angst M, [place]   2 years ago Hypertension, unspecified type   [facility] [place], Adriana M, [place]   2 years ago Essential hypertension   [facility], Lavella Hammock, [place]       Future Appointments             In 1 month Jacky Kindle, FNP [facility], [facility]             Passed - Last Heart Rate in normal range    Pulse Readings from Last 1 Encounters:  12/24/20 77             Requested Prescriptions  Pending Prescriptions Disp Refills   losartan (COZAAR) 50 MG tablet [Pharmacy Med Name: LOSARTAN POTASSIUM 50 MG TAB] 30 tablet 0    Sig: TAKE 1 TABLET BY MOUTH EVERY DAY     Cardiovascular:  Angiotensin Receptor Blockers Failed - 07/19/2021  9:08 AM      Failed - Cr in normal range and within 180 days    Creatinine, Ser  Date Value Ref Range Status  03/05/2019 0.91 0.57 - 1.00 mg/dL Final          Failed - K in normal range and within 180 days    Potassium  Date Value Ref Range Status  03/05/2019 4.1 3.5 - 5.2 mmol/L Final          Failed - Last BP in normal range    BP Readings from Last 1 Encounters:  12/24/20 (!) 151/72          Failed - Valid encounter within last 6 months    Recent Outpatient Visits           6 months ago Hyperlipidemia, unspecified hyperlipidemia type   [facility] [place], Lavella Hammock, [place]   1 year ago Infertility management   [facility] [place], Lavella Hammock, [place]   1 year ago Essential hypertension   [facility] Osvaldo Angst M, [place]   2 years ago Hypertension, unspecified type   [facility] [place], Lavella Hammock, [place]   2 years ago Essential hypertension   [facility] [place], Lavella Hammock, [place]       Future Appointments             In 1 month Jacky Kindle, FNP [facility], [facility]            Passed - Patient is not pregnant       amLODipine (NORVASC) 5 MG tablet [Pharmacy Med Name: AMLODIPINE BESYLATE 5 MG TAB] 30 tablet 0  Sig: Take 1 tablet (5 mg total) by mouth daily.     Cardiovascular:  Calcium Channel Blockers Failed - 07/19/2021  9:08 AM      Failed - Last BP in normal range    BP Readings from Last 1 Encounters:  12/24/20 (!) 151/72          Failed - Valid encounter within last 6 months    Recent  Outpatient Visits           6 months ago Hyperlipidemia, unspecified hyperlipidemia type   [facility] [place], Lavella Hammock, [place]   1 year ago Infertility management   [facility] [place], Lavella Hammock, [place]   1 year ago Essential hypertension   [facility] Osvaldo Angst M, [place]   2 years ago Hypertension, unspecified type   [facility] Osvaldo Angst M, [place]   2 years ago Essential hypertension   [facility] Trey Sailors, [place]       Future Appointments             In 1 month Jacky Kindle, FNP [facility], [facility]             carvedilol (COREG) 12.5 MG tablet [employer] Med Name: CARVEDILOL 12.5 MG TABLET] 60 tablet 0    Sig: Take 1 tablet (12.5 mg total) by mouth 2 (two) times daily with a meal.     Cardiovascular:  Beta Blockers Failed - 07/19/2021  9:08 AM      Failed - Last BP in normal range    BP Readings from Last 1 Encounters:  12/24/20 (!) 151/72          Failed - Valid encounter within last 6 months    Recent Outpatient Visits           6 months ago Hyperlipidemia, unspecified hyperlipidemia type   [facility] [place], Lavella Hammock, [place]   1 year ago Infertility management   [facility] [place], Lavella Hammock, [place]   1 year ago Essential hypertension   [facility] Osvaldo Angst M, [place]   2 years ago Hypertension, unspecified type   [facility], Lavella Hamm[place], [place]   2 years ago Essential hypertension   [facility] Trey [place], [place]       Future Appointments             In 1 month Jacky Kindle, FNP [facility], [facility]            Passed - Last Heart Rate in normal range    Pulse Readings from Last 1 Encounters:  12/24/20 77

## 2021-08-18 ENCOUNTER — Other Ambulatory Visit: Payer: Self-pay | Admitting: Family Medicine

## 2021-08-18 DIAGNOSIS — I10 Essential (primary) hypertension: Secondary | ICD-10-CM

## 2021-09-13 ENCOUNTER — Other Ambulatory Visit: Payer: Self-pay | Admitting: Family Medicine

## 2021-09-13 DIAGNOSIS — I10 Essential (primary) hypertension: Secondary | ICD-10-CM

## 2021-09-13 NOTE — Telephone Encounter (Signed)
Pt. Has appointment 09/15/21. Requested Prescriptions  Pending Prescriptions Disp Refills  . carvedilol (COREG) 12.5 MG tablet [Pharmacy Med Name: CARVEDILOL 12.5 MG TABLET] 60 tablet 0    Sig: TAKE 1 TABLET (12.5 MG TOTAL) BY MOUTH 2 (TWO) TIMES DAILY WITH A MEAL.     Cardiovascular:  Beta Blockers Failed - 09/13/2021 11:33 AM      Failed - Last BP in normal range    BP Readings from Last 1 Encounters:  12/24/20 (!) 151/72         Failed - Valid encounter within last 6 months    Recent Outpatient Visits          8 months ago Hyperlipidemia, unspecified hyperlipidemia type   [facility], Lavella Hammock, [place]   1 year ago Infertility management   [facility] Osvaldo Angst M, [place]   2 years ago Essential hypertension   [facility] Osvaldo Angst M, [place]   2 years ago Hypertension, unspecified type   [facility] Osvaldo Angst M, [place]   2 years ago Essential hypertension   [facility] Trey Sailors, [place]      Future Appointments            In 2 days Jacky Kindle, FNP [facility], [facility]           Passed - Last Heart Rate in normal range    Pulse Readings from Last 1 Encounters:  12/24/20 77         . losartan (COZAAR) 50 MG tablet [Pharmacy Med Name: LOSARTAN POTASSIUM 50 MG TAB] 30 tablet 0    Sig: TAKE 1 TABLET BY MOUTH EVERY DAY     Cardiovascular:  Angiotensin Receptor Blockers Failed - 09/13/2021 11:33 AM      Failed - Cr in normal range and within 180 days    Creatinine, Ser  Date Value Ref Range Status  03/05/2019 0.91 0.57 - 1.00 mg/dL Final         Failed - K in normal range and within 180 days    Potassium  Date Value Ref Range Status  03/05/2019 4.1 3.5 - 5.2 mmol/L Final         Failed - Last BP in normal range    BP Readings from Last 1 Encounters:  12/24/20 (!) 151/72         Failed - Valid encounter within last 6 months    Recent Outpatient Visits           8 months ago Hyperlipidemia, unspecified hyperlipidemia type   [facility] [place], Lavella Hammock, [place]   1 year ago Infertility management   [facility] [place], Lavella Hammock, [place]   2 years ago Essential hypertension   [facility] Osvaldo Angst M, [place]   2 years ago Hypertension, unspecified type   [facility] [place], Lavella Hammock, [place]   2 years ago Essential hypertension   [facility] Trey Sailors, [place]      Future Appointments            In 2 days Jacky Kindle, FNP [facility], [facility]           Passed - Patient is not pregnant      . amLODipine (NORVASC) 5 MG tablet [Pharmacy Med Name: AMLODIPINE BESYLATE 5 MG TAB] 30 tablet 0    Sig: TAKE 1 TABLET (5 MG TOTAL) BY MOUTH DAILY.  Cardiovascular:  Calcium Channel Blockers Failed - 09/13/2021 11:33 AM      Failed - Last BP in normal range    BP Readings from Last 1 Encounters:  12/24/20 (!) 151/72         Failed - Valid encounter within last 6 months    Recent Outpatient Visits          8 months ago Hyperlipidemia, unspecified hyperlipidemia type   [facility] [place], Lavella Hammock, [place]   1 year ago Infertility management   [facility] [place], Lavella Hammock, [place]   2 years ago Essential hypertension   [facility] Osvaldo Angst M, [place]   2 years ago Hypertension, unspecified type   [facility] [place], Lavella Hammock, [place]   2 years ago Essential hypertension   [facility] Trey Sailors, [place]      Future Appointments            In 2 days Jacky Kindle, FNP [facility], [facility]

## 2021-09-15 ENCOUNTER — Other Ambulatory Visit: Payer: Self-pay

## 2021-09-15 ENCOUNTER — Encounter: Payer: Self-pay | Admitting: Family Medicine

## 2021-09-15 ENCOUNTER — Ambulatory Visit (INDEPENDENT_AMBULATORY_CARE_PROVIDER_SITE_OTHER): Payer: 59 | Admitting: Family Medicine

## 2021-09-15 VITALS — BP 143/71 | HR 80 | Resp 16 | Wt 175.2 lb

## 2021-09-15 DIAGNOSIS — G8929 Other chronic pain: Secondary | ICD-10-CM | POA: Diagnosis not present

## 2021-09-15 DIAGNOSIS — I10 Essential (primary) hypertension: Secondary | ICD-10-CM | POA: Diagnosis not present

## 2021-09-15 DIAGNOSIS — M545 Low back pain, unspecified: Secondary | ICD-10-CM | POA: Diagnosis not present

## 2021-09-15 DIAGNOSIS — E785 Hyperlipidemia, unspecified: Secondary | ICD-10-CM | POA: Diagnosis not present

## 2021-09-15 MED ORDER — BACLOFEN 10 MG PO TABS: 10.0000 mg | ORAL_TABLET | Freq: Three times a day (TID) | ORAL | 0 refills | Status: DC

## 2021-09-15 MED ORDER — HYDROCHLOROTHIAZIDE 12.5 MG PO CAPS: 12.5000 mg | ORAL_CAPSULE | Freq: Every day | ORAL | 11 refills | Status: DC

## 2021-09-15 NOTE — Assessment & Plan Note (Signed)
Chronic, elevated Denies dizziness with current plan Slight improvement since last change Weight has increased Advised DASH diet and exercise

## 2021-09-15 NOTE — Progress Notes (Signed)
Established patient visit   Patient: Anna Ortega   DOB: [birth year]   40 y.o. Female  MRN: [id number] Visit Date: 09/15/2021  Today's healthcare provider: Jacky Kindle, FNP   Chief Complaint  Patient presents with   Hypertension   Hyperlipidemia   Subjective    HPI  Lipid/Cholesterol, Follow-up  Last lipid panel Other pertinent labs  Lab Results  Component Value Date   CHOL 295 (H) 03/05/2019   HDL 53 03/05/2019   LDLCALC 202 (H) 03/05/2019   TRIG 198 (H) 03/05/2019   CHOLHDL 5.6 (H) 03/05/2019   Lab Results  Component Value Date   ALT 20 03/05/2019   AST 15 03/05/2019   PLT 341 03/05/2019   TSH 3.09 02/12/2020     She was last seen for this 9 months ago.  Management since that visit includes restart statin.  She reports excellent compliance with treatment. She is not having side effects.   Symptoms: No chest pain No chest pressure/discomfort  No dyspnea No lower extremity edema  No numbness or tingling of extremity No orthopnea  No palpitations No paroxysmal nocturnal dyspnea  No speech difficulty No syncope   Current diet: well balanced Current exercise: not asked  The 10-year ASCVD risk score (Arnett DK, et al., 2019) is: 2.5%  ---------------------------------------------------------------------------------------------------  Hypertension, follow-up  BP Readings from Last 3 Encounters:  09/15/21 (!) 143/71  12/24/20 (!) 151/72  11/24/20 (!) 134/91   Wt Readings from Last 3 Encounters:  09/15/21 175 lb 3.2 oz (79.5 kg)  12/24/20 170 lb 6.4 oz (77.3 kg)  12/10/19 169 lb 6.4 oz (76.8 kg)     She was last seen for hypertension 9 months ago.  BP at that visit was 151/72. Management since that visit includes resume previous regimne instructed hot to taper of Labetalol.  She reports excellent compliance with treatment. She is not having side effects.  She is following a Regular diet. She is not exercising. She does not smoke.  Use  of agents associated with hypertension: none.   Outside blood pressures are not being checked. Symptoms: No chest pain No chest pressure  No palpitations No syncope  No dyspnea No orthopnea  No paroxysmal nocturnal dyspnea No lower extremity edema   Pertinent labs: Lab Results  Component Value Date   CHOL 295 (H) 03/05/2019   HDL 53 03/05/2019   LDLCALC 202 (H) 03/05/2019   TRIG 198 (H) 03/05/2019   CHOLHDL 5.6 (H) 03/05/2019   Lab Results  Component Value Date   NA 137 03/05/2019   K 4.1 03/05/2019   CREATININE 0.91 03/05/2019   GFRNONAA 80 03/05/2019   GLUCOSE 91 03/05/2019   TSH 3.09 02/12/2020     The 10-year ASCVD risk score (Arnett DK, et al., 2019) is: 2.5%   ---------------------------------------------------------------------------------------------------     Medications: Outpatient Medications Prior to Visit  Medication Sig   amLODipine (NORVASC) 5 MG tablet TAKE 1 TABLET (5 MG TOTAL) BY MOUTH DAILY.   atorvastatin (LIPITOR) 40 MG tablet Take 1 tablet (40 mg total) by mouth daily.   carvedilol (COREG) 12.5 MG tablet TAKE 1 TABLET (12.5 MG TOTAL) BY MOUTH 2 (TWO) TIMES DAILY WITH A MEAL.   losartan (COZAAR) 50 MG tablet TAKE 1 TABLET BY MOUTH EVERY DAY   MAGNESIUM PO Take by mouth.   Vitamin D, Ergocalciferol, (DRISDOL) 1.25 MG (50000 UNIT) CAPS capsule Take 50,000 Units by mouth every 7 (seven) days.   No facility-administered medications prior to visit.  Review of Systems     Objective    BP (!) 143/71   Pulse 80   Resp 16   Wt 175 lb 3.2 oz (79.5 kg)   SpO2 100%   BMI 35.39 kg/m    Physical Exam Vitals and nursing note reviewed.  Constitutional:      General: She is not in acute distress.    Appearance: Normal appearance. She is obese. She is not ill-appearing, toxic-appearing or diaphoretic.  HENT:     Head: Normocephalic and atraumatic.  Cardiovascular:     Rate and Rhythm: Normal rate and regular rhythm.     Pulses: Normal pulses.      Heart sounds: Normal heart sounds. No murmur heard.   No friction rub. No gallop.  Pulmonary:     Effort: Pulmonary effort is normal. No respiratory distress.     Breath sounds: Normal breath sounds. No stridor. No wheezing, rhonchi or rales.  Chest:     Chest wall: No tenderness.  Abdominal:     General: Bowel sounds are normal.     Palpations: Abdomen is soft.  Musculoskeletal:        General: No swelling, tenderness, deformity or signs of injury. Normal range of motion.     Right lower leg: No edema.     Left lower leg: No edema.  Skin:    General: Skin is warm and dry.     Capillary Refill: Capillary refill takes less than 2 seconds.     Coloration: Skin is not jaundiced or pale.     Findings: No bruising, erythema, lesion or rash.  Neurological:     General: No focal deficit present.     Mental Status: She is alert and oriented to person, place, and time. Mental status is at baseline.     Cranial Nerves: No cranial nerve deficit.     Sensory: No sensory deficit.     Motor: No weakness.     Coordination: Coordination normal.  Psychiatric:        Mood and Affect: Mood normal.        Behavior: Behavior normal.        Thought Content: Thought content normal.        Judgment: Judgment normal.     No results found for any visits on 09/15/21.  Assessment & Plan     Problem List Items Addressed This Visit       Cardiovascular and Mediastinum   Hypertension - Primary    Chronic, elevated Denies dizziness with current plan Slight improvement since last change Weight has increased Advised DASH diet and exercise      Relevant Medications   hydrochlorothiazide (MICROZIDE) 12.5 MG capsule     Other   Hyperlipidemia    Denies difficulty with statin Repeat lab work We recommend diet low in saturated fat and regular exercise - 30 min at least 5 times per week       Relevant Medications   hydrochlorothiazide (MICROZIDE) 12.5 MG capsule   Other Relevant Orders    Lipid panel   Chronic bilateral low back pain without sciatica    Trial of baclofen       Relevant Medications   baclofen (LIORESAL) 10 MG tablet     Return in about 4 weeks (around 10/13/2021) for blood pressure check, nurse follow up, HTN management.      Leilani Merl, FNP, have reviewed all documentation for this visit. The documentation on 09/15/21 for the exam, diagnosis,  procedures, and orders are all accurate and complete.    Gwyneth Sprout, [place] [phone] (phone) [phone] (fax)  [place]

## 2021-09-15 NOTE — Assessment & Plan Note (Signed)
Trial of baclofen.

## 2021-09-15 NOTE — Assessment & Plan Note (Signed)
Denies difficulty with statin Repeat lab work We recommend diet low in saturated fat and regular exercise - 30 min at least 5 times per week

## 2021-09-16 ENCOUNTER — Other Ambulatory Visit: Payer: Self-pay | Admitting: Family Medicine

## 2021-09-16 DIAGNOSIS — I10 Essential (primary) hypertension: Secondary | ICD-10-CM

## 2021-09-16 DIAGNOSIS — E785 Hyperlipidemia, unspecified: Secondary | ICD-10-CM

## 2021-09-16 LAB — LIPID PANEL
Chol/HDL Ratio: 3 ratio (ref 0.0–4.4)
Cholesterol, Total: 166 mg/dL (ref 100–199)
HDL: 55 mg/dL (ref >39)
LDL Chol Calc (NIH): 87 mg/dL (ref 0–99)
Triglycerides: 135 mg/dL (ref 0–149)
VLDL Cholesterol Cal: 24 mg/dL (ref 5–40)

## 2021-09-16 MED ORDER — LOSARTAN POTASSIUM 50 MG PO TABS: 50.0000 mg | ORAL_TABLET | Freq: Every day | ORAL | 3 refills | Status: DC

## 2021-09-16 MED ORDER — CARVEDILOL 12.5 MG PO TABS: 12.5000 mg | ORAL_TABLET | Freq: Two times a day (BID) | ORAL | 3 refills | Status: DC

## 2021-09-16 MED ORDER — AMLODIPINE BESYLATE 5 MG PO TABS: 5.0000 mg | ORAL_TABLET | Freq: Every day | ORAL | 3 refills | Status: DC

## 2021-09-16 MED ORDER — ATORVASTATIN CALCIUM 40 MG PO TABS: 40.0000 mg | ORAL_TABLET | Freq: Every day | ORAL | 3 refills | Status: DC

## 2021-10-20 ENCOUNTER — Other Ambulatory Visit: Payer: Self-pay | Admitting: Neurology

## 2021-10-20 DIAGNOSIS — Z8249 Family history of ischemic heart disease and other diseases of the circulatory system: Secondary | ICD-10-CM

## 2021-11-11 ENCOUNTER — Other Ambulatory Visit: Payer: Self-pay | Admitting: Neurology

## 2021-11-11 ENCOUNTER — Other Ambulatory Visit: Payer: Self-pay

## 2021-11-11 ENCOUNTER — Ambulatory Visit
Admission: RE | Admit: 2021-11-11 | Discharge: 2021-11-11 | Disposition: A | Discharge status: Home / Self Care | Payer: Commercial Managed Care - PPO | Source: Ambulatory Visit | Attending: Neurology | Admitting: Neurology

## 2021-11-11 DIAGNOSIS — Z8249 Family history of ischemic heart disease and other diseases of the circulatory system: Secondary | ICD-10-CM

## 2021-11-11 IMAGING — CT CT HEAD W/O CM
4 series · 15 of 47 positions shown, 17 images · non-contrast
Comparison: Head MRI [DATE]

CLINICAL DATA: Family history of aneurysm.  Elevated prolactin.

EXAM:
CT HEAD WITHOUT CONTRAST
TECHNIQUE: Contiguous axial images were obtained from the base of the skull
through the vertex without intravenous contrast.

[Series 2: axial st head 5.00 ax · axial · 0.35mm/px · z∈[-629,-498]mm · 7 of 37 slices shown, 9 images]
[im 5/37  brain]
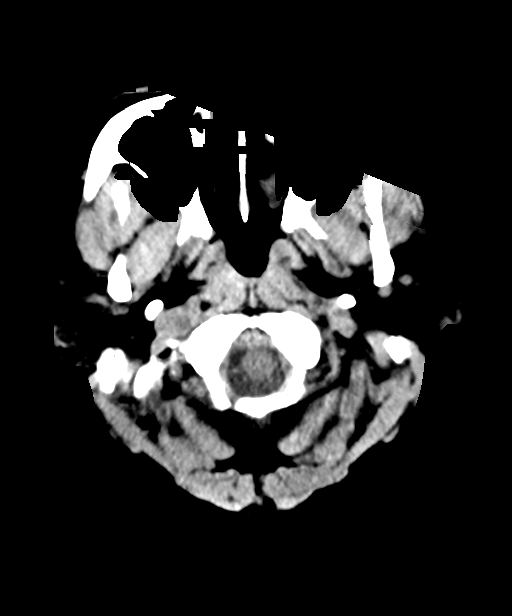
[im 5/37  bone]
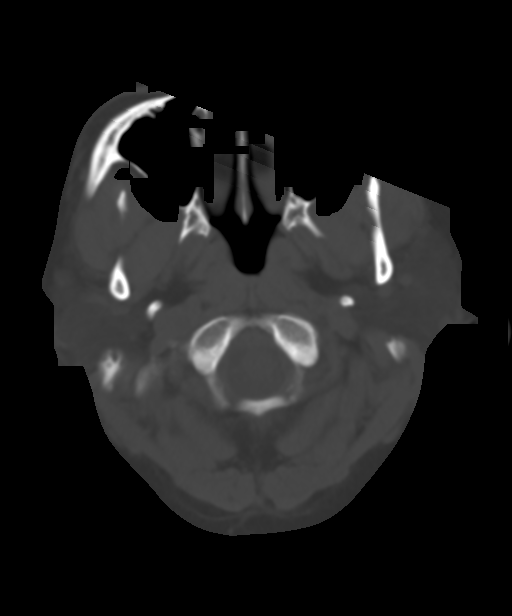
[im 10/37  brain]
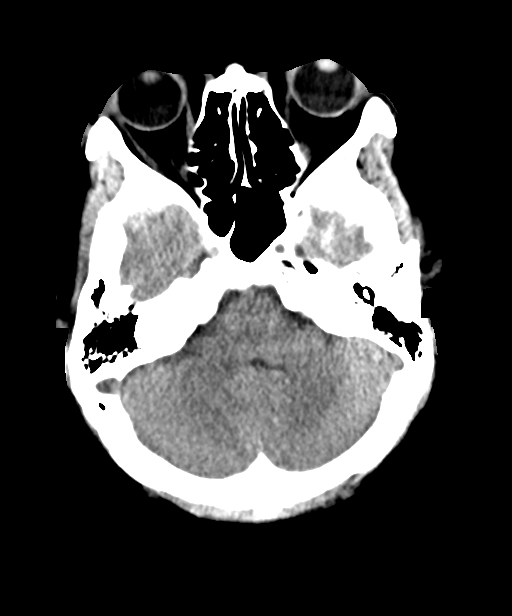
[im 14/37  brain]
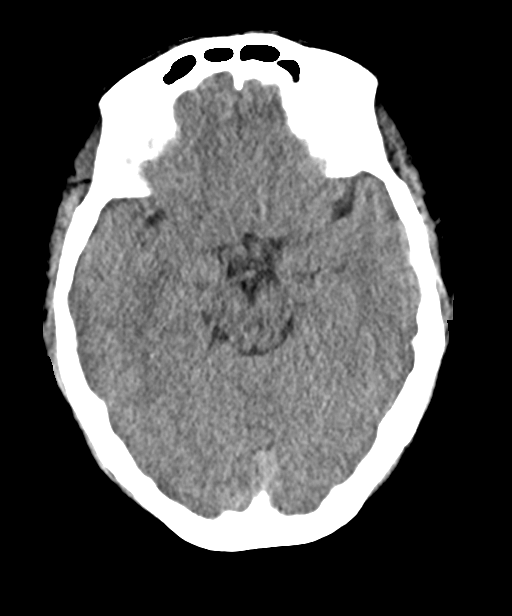
[im 19/37  brain]
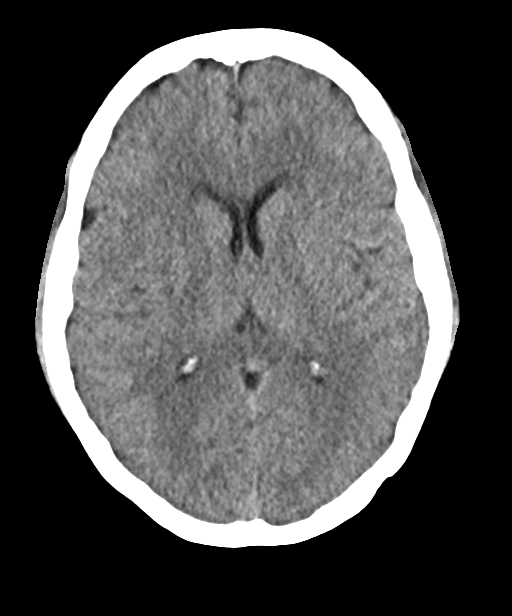
[im 23/37  brain]
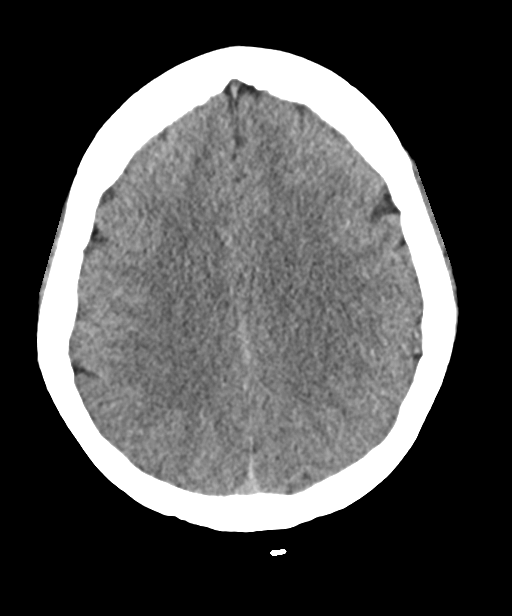
[im 23/37  bone]
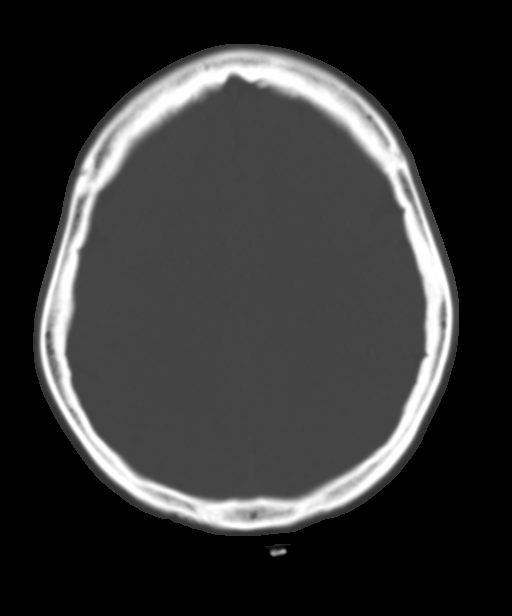
[im 28/37  brain]
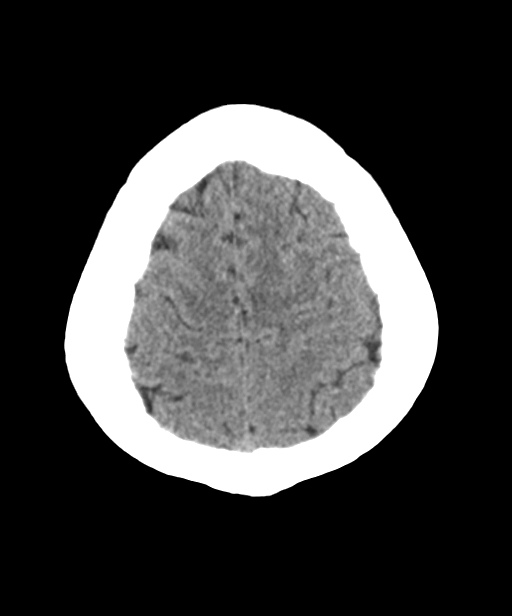
[im 32/37  brain]
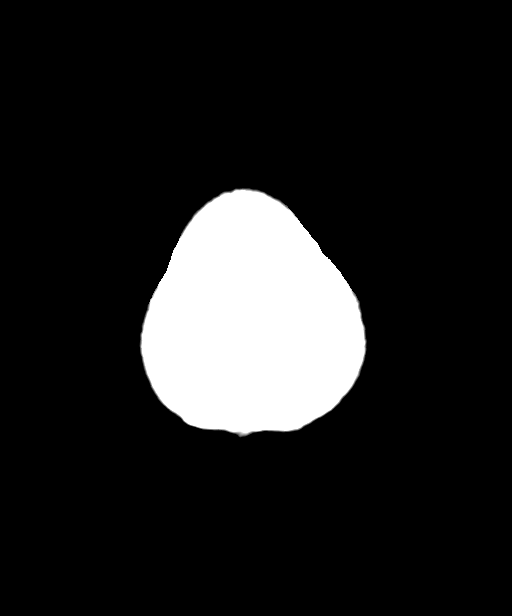

[Series 4: axial bone head 2.00 ax · axial · 0.36mm/px · z∈[-610,-594]mm · 2 of 85 slices shown]
[im 9/85  bone]
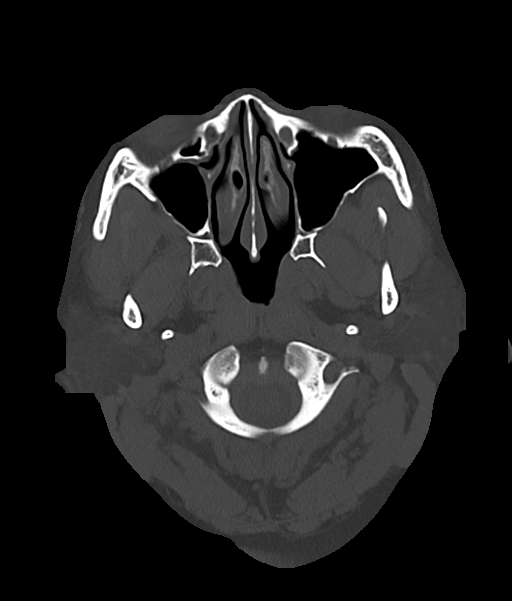
[im 17/85  bone]
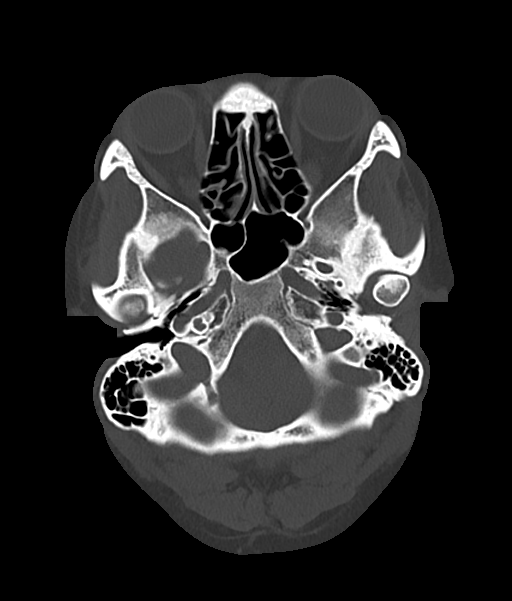

[Series 6: coronals head 3.00 cor · coronal · 0.36mm/px · 3 of 71 slices shown]
[im 24/71  brain]
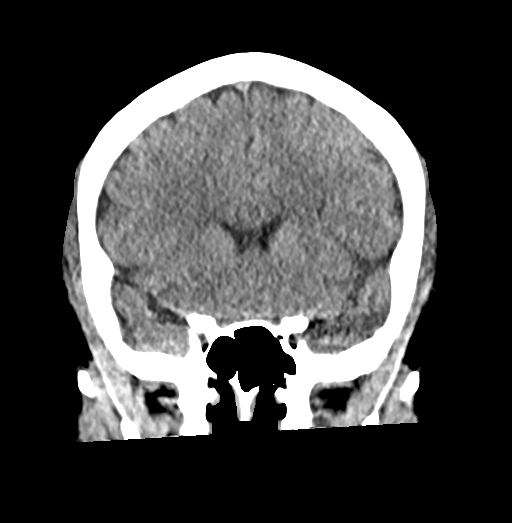
[im 32/71  brain]
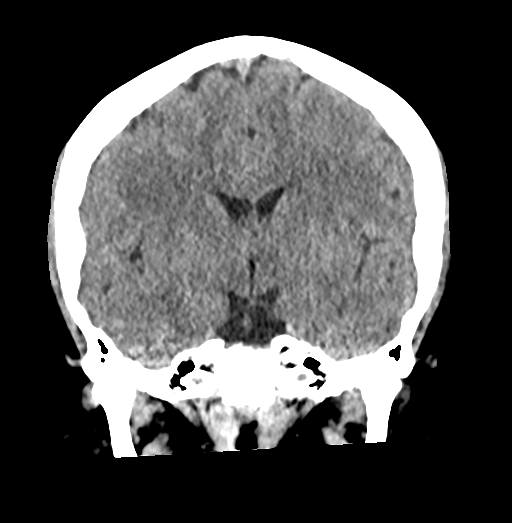
[im 39/71  brain]
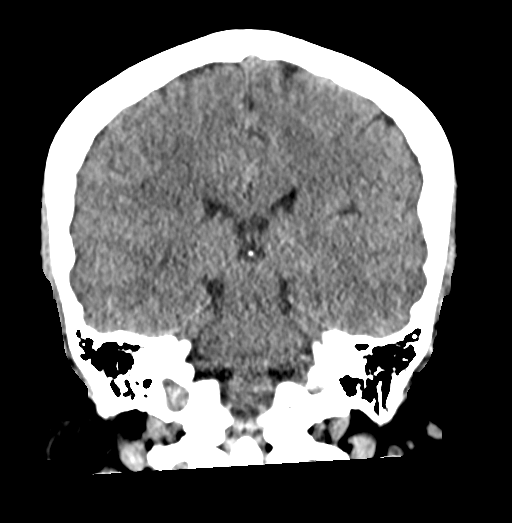

[Series 8: sagittals head 3.00 sag · sagittal · 0.37mm/px · 3 of 62 slices shown]
[im 21/62  brain]
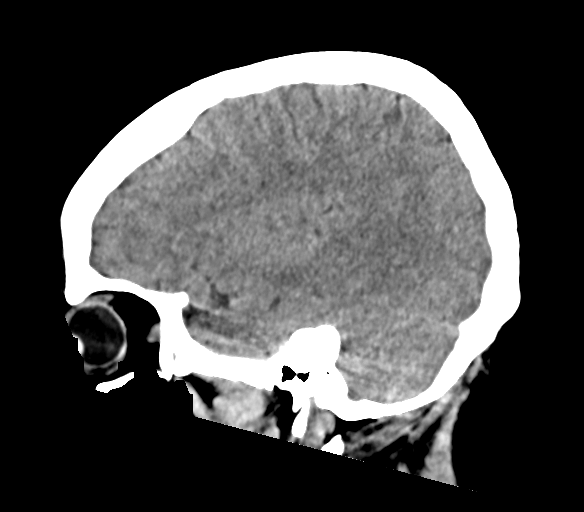
[im 31/62  brain]
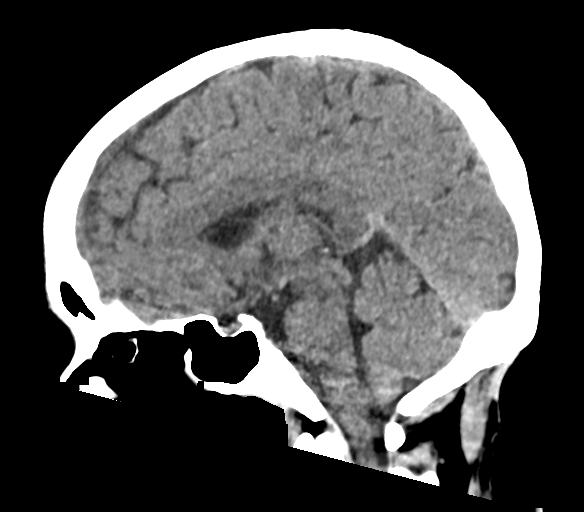
[im 41/62  brain]
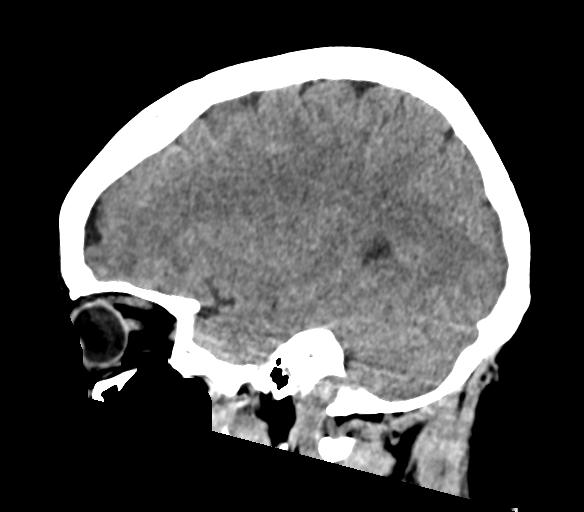

[15 of 47 positions shown; findings below may reference images not displayed]

FINDINGS: Brain: There is no evidence of an acute infarct, intracranial
hemorrhage, mass, midline shift, or extra-axial fluid collection.
The ventricles and sulci are normal. Hypodensities in the cerebral
white matter are mild but abnormal for age, and age advanced white
matter disease was better demonstrated on the prior MRI.

Vascular: Mild calcified atherosclerosis at the skull base. No
hyperdense vessel. A CTA would be required to evaluate the suspected
small right paraophthalmic aneurysm noted on MRI.

Skull: No fracture or suspicious osseous lesion.

Sinuses/Orbits: Mild mucosal thickening in the ethmoid sinuses.
Clear mastoid air cells. Unremarkable orbits.

Other: None.
IMPRESSION: 1. No evidence of acute intracranial abnormality.
2. Mild chronic cerebral white matter disease, better demonstrated
on the prior MRI.
3. A CTA would be required to evaluate the suspected small right
paraophthalmic aneurysm noted on MRI.

## 2021-11-19 ENCOUNTER — Other Ambulatory Visit: Payer: Self-pay | Admitting: Student

## 2021-11-19 DIAGNOSIS — Z8249 Family history of ischemic heart disease and other diseases of the circulatory system: Secondary | ICD-10-CM

## 2021-11-22 ENCOUNTER — Ambulatory Visit: Admission: RE | Admit: 2021-11-22 | Payer: Commercial Managed Care - PPO | Source: Ambulatory Visit

## 2021-11-25 ENCOUNTER — Other Ambulatory Visit: Payer: Self-pay

## 2021-11-25 ENCOUNTER — Ambulatory Visit
Admission: RE | Admit: 2021-11-25 | Discharge: 2021-11-25 | Disposition: A | Discharge status: Home / Self Care | Payer: Commercial Managed Care - PPO | Source: Ambulatory Visit | Attending: Student | Admitting: Student

## 2021-11-25 DIAGNOSIS — Z8249 Family history of ischemic heart disease and other diseases of the circulatory system: Secondary | ICD-10-CM | POA: Insufficient documentation

## 2021-11-25 DIAGNOSIS — I779 Disorder of arteries and arterioles, unspecified: Secondary | ICD-10-CM | POA: Diagnosis not present

## 2021-11-25 IMAGING — MR MR MRA HEAD W/O CM
1 series · 19 of 48 positions shown · non-contrast
Comparison: Comparison made with prior brain MRI from [DATE].

CLINICAL DATA: Follow-up examination for aneurysm.

EXAM:
MRA HEAD WITHOUT CONTRAST
TECHNIQUE: Angiographic images of the Circle of Willis were acquired using MRA
technique without intravenous contrast.

[Series 5: TOF · axial · 0.5mm · 0.41mm/px · z∈[-100,-2]mm · 19 of 205 slices shown]
[im 1/205]
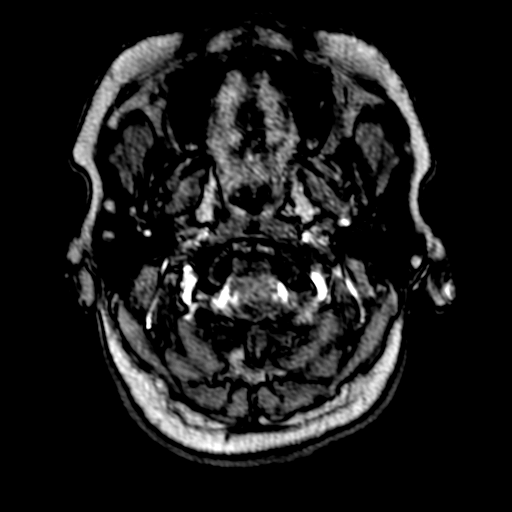
[im 5/205]
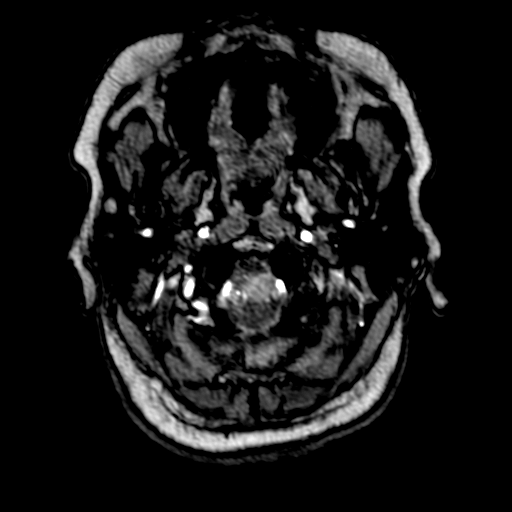
[im 9/205]
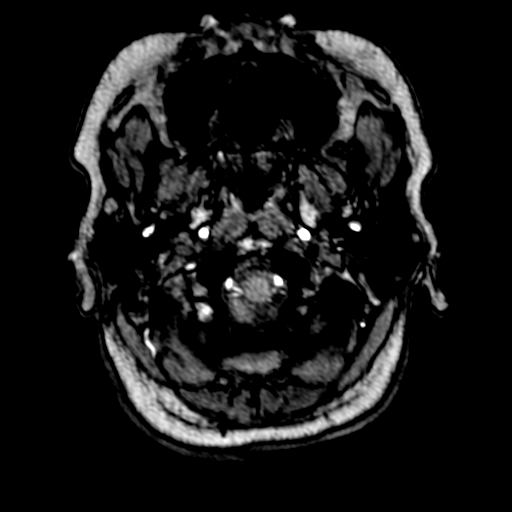
[im 14/205]
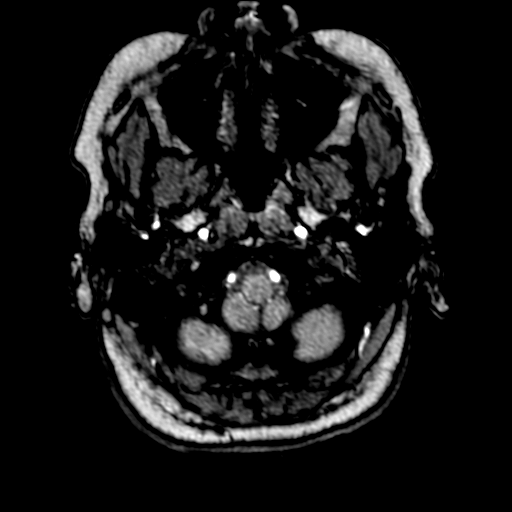
[im 18/205]
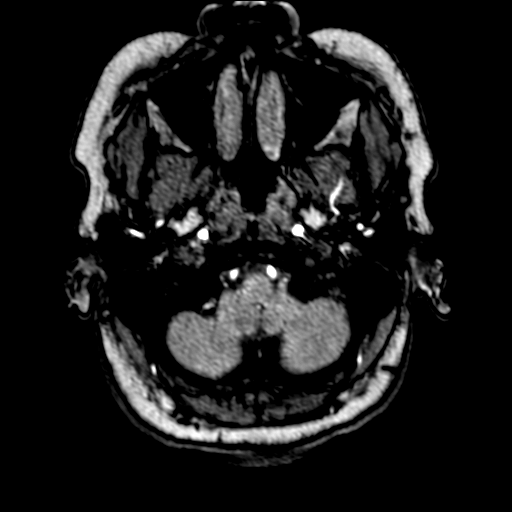
[im 22/205]
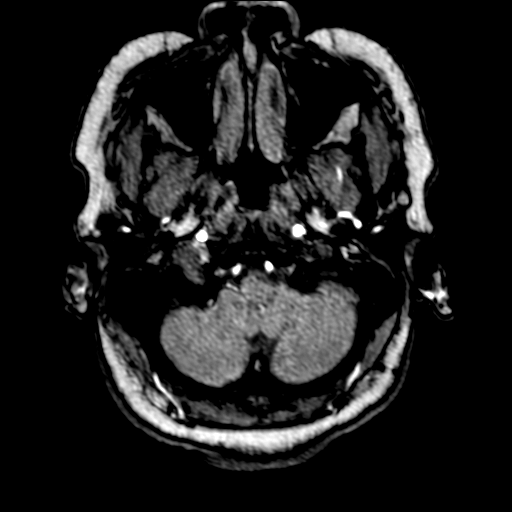
[im 27/205]
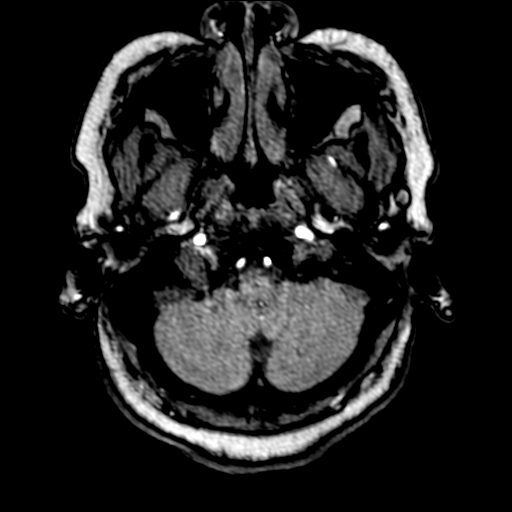
[im 31/205]
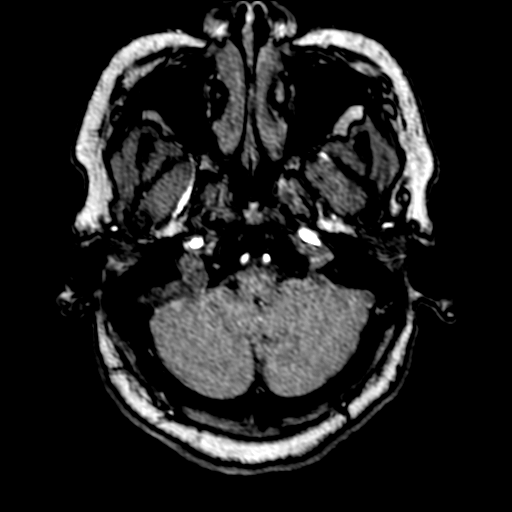
[im 35/205]
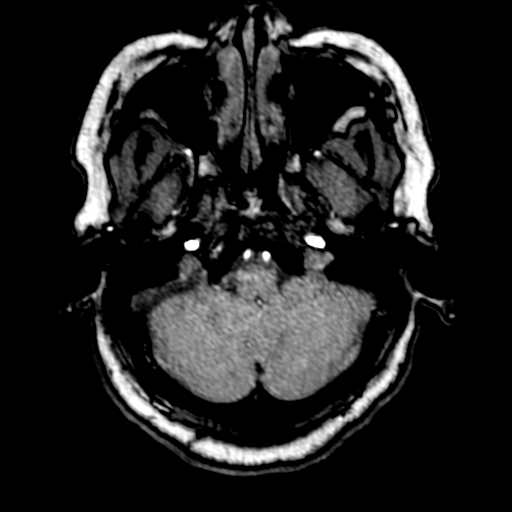
[im 40/205]
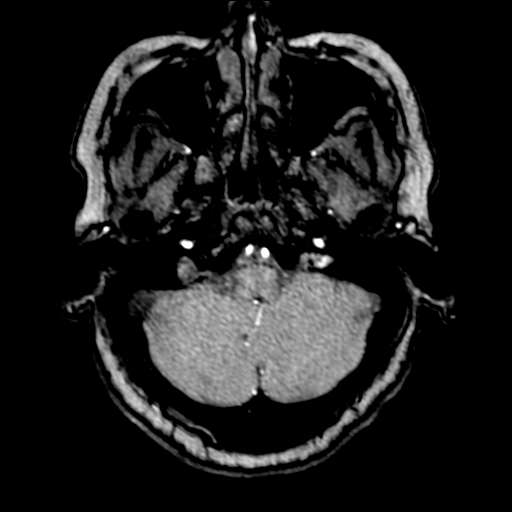
[im 44/205]
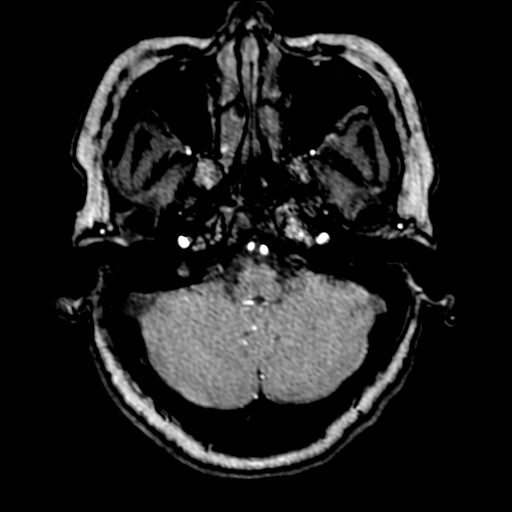
[im 66/205]
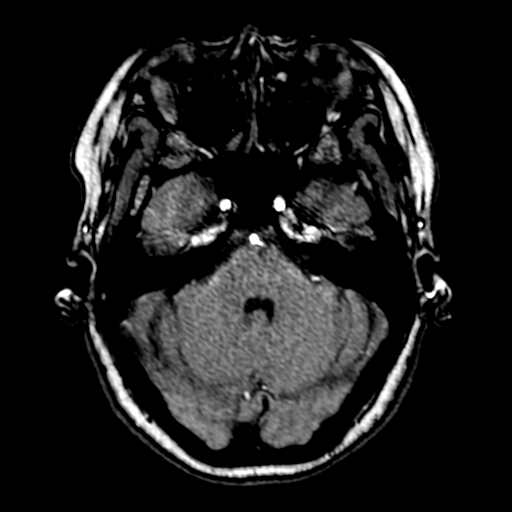
[im 92/205]
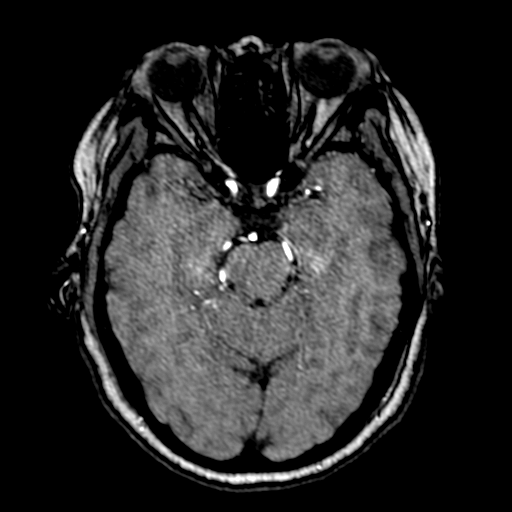
[im 105/205]
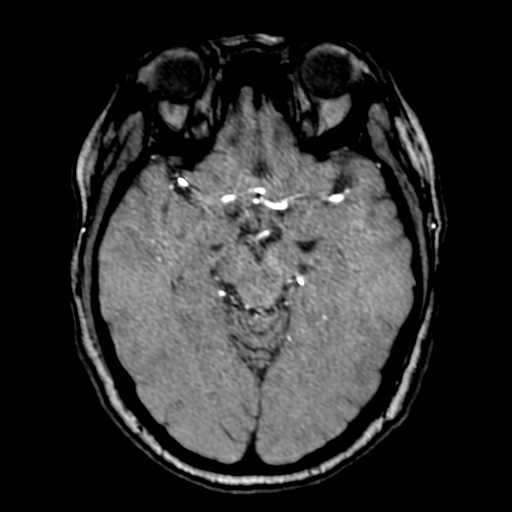
[im 118/205]
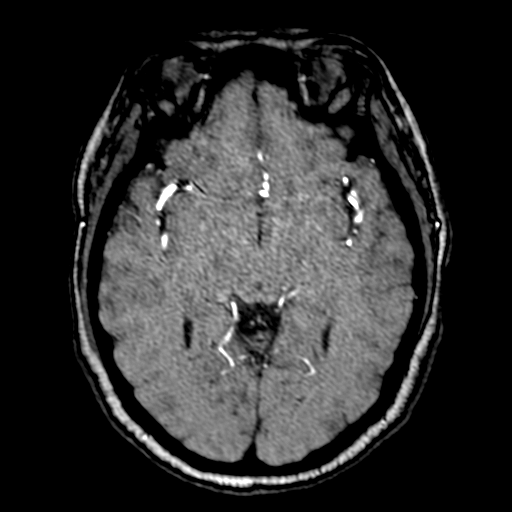
[im 144/205]
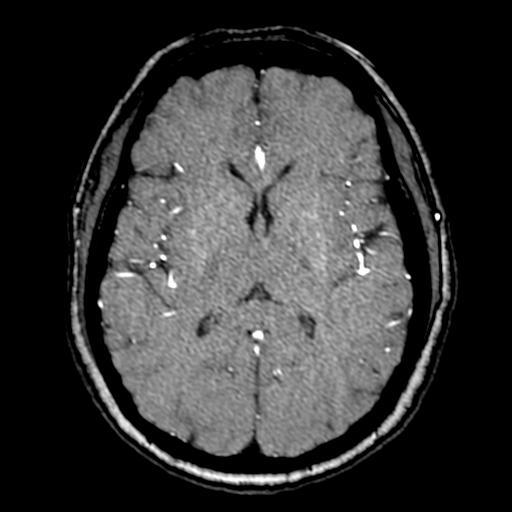
[im 170/205]
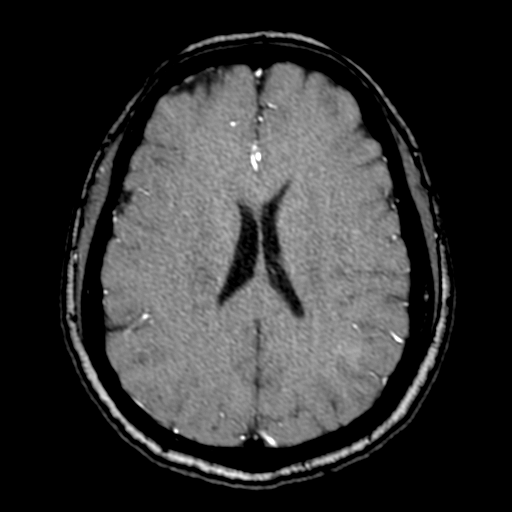
[im 174/205]
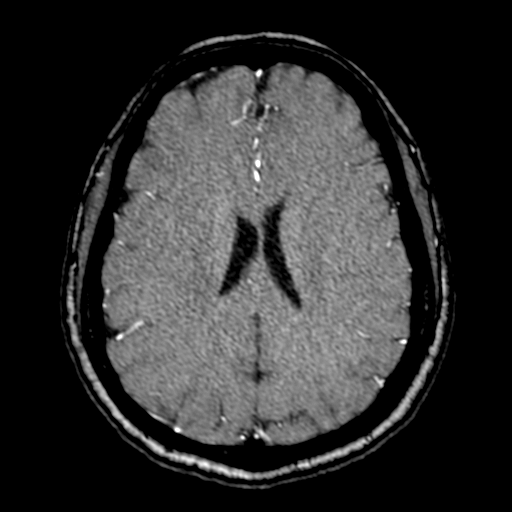
[im 196/205]
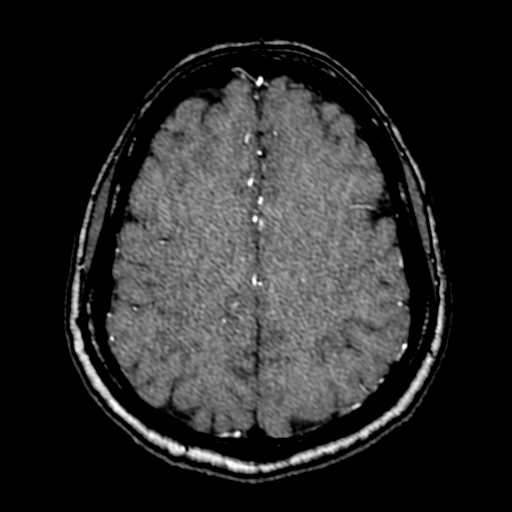

[19 of 48 positions shown; findings below may reference images not displayed]

FINDINGS: Anterior circulation: Examination mildly degraded by motion
artifact.

Visualized distal cervical segments of the internal carotid arteries
are widely patent with antegrade flow. Artifact noted about the
cervical petrous junctions bilaterally. Petrous, cavernous, and
supraclinoid segments patent bilaterally without stenosis. On
time-of-flight sequence, there is a subtle 2 mm saccular outpouching
extending cephalad from the paraclinoid right ICA, suspicious for a
small paraophthalmic aneurysm, and corresponding with abnormality on
prior MRI (series 5, image 98). This is not well seen on MIP
reconstructions. ICA termini well perfused distally. Right A1
slightly hypoplastic. Normal anterior communicating artery complex.
Anterior cerebral arteries patent without stenosis. No M1 stenosis
or occlusion. Normal MCA bifurcations. Distal MCA branches well
perfused and symmetric.

Posterior circulation: Vertebral arteries are largely code dominant
and patent to the vertebrobasilar junction without stenosis. Neither
PICA origin well visualized. Basilar widely patent to its distal
aspect. Superior cerebellar arteries patent bilaterally. Both PCAs
primarily supplied via the basilar well perfused or distal aspects.

Anatomic variants: None significant.

Other: None.
IMPRESSION: 1. 2 mm saccular outpouching extending cephalad from the paraclinoid
right ICA, suspicious for a small paraophthalmic aneurysm. Finding
corresponds with abnormality on prior brain MRI from [DATE].
[DATE]. Otherwise normal intracranial MRA.

## 2022-07-08 ENCOUNTER — Other Ambulatory Visit: Payer: Self-pay | Admitting: Family Medicine

## 2022-07-08 DIAGNOSIS — E785 Hyperlipidemia, unspecified: Secondary | ICD-10-CM

## 2022-07-08 NOTE — Telephone Encounter (Signed)
rx was sent to pharmacy on 09/16/21 #90/3 RF. Pt will need updated labs prior to refills  Requested Prescriptions  Pending Prescriptions Disp Refills  . atorvastatin (LIPITOR) 40 MG tablet [Pharmacy Med Name: ATORVASTATIN 40 MG TABLET] 90 tablet 3    Sig: TAKE 1 TABLET BY MOUTH EVERY DAY     Cardiovascular:  Antilipid - Statins Failed - 07/08/2022  2:18 AM      Failed - Lipid Panel in normal range within the last 12 months    Cholesterol, Total  Date Value Ref Range Status  09/15/2021 166 100 - 199 mg/dL Final   LDL Chol Calc (NIH)  Date Value Ref Range Status  09/15/2021 87 0 - 99 mg/dL Final   HDL  Date Value Ref Range Status  09/15/2021 55 >39 mg/dL Final   Triglycerides  Date Value Ref Range Status  09/15/2021 135 0 - 149 mg/dL Final         Passed - Patient is not pregnant      Passed - Valid encounter within last 12 months    Recent Outpatient Visits          9 months ago Hypertension, unspecified type   [facility] Jacky Kindle, FNP   1 year ago Hyperlipidemia, unspecified hyperlipidemia type   [facility] [place], [place], [place]   2 years ago Infertility management   [facility] [place], [place], [place]   2 years ago Essential hypertension   [facility] [place], [place]   2 years ago Hypertension, unspecified type   [facility] [place], [place], [place]

## 2022-07-19 ENCOUNTER — Other Ambulatory Visit: Payer: Self-pay | Admitting: Family Medicine

## 2022-07-19 ENCOUNTER — Telehealth: Payer: Self-pay

## 2022-07-19 DIAGNOSIS — I10 Essential (primary) hypertension: Secondary | ICD-10-CM

## 2022-07-19 NOTE — Telephone Encounter (Signed)
Called patient to schedule an appt for f/u and medication refills. LMTCB Okay for PEC to schedule

## 2022-07-19 NOTE — Telephone Encounter (Signed)
Called, left vm to call back. 

## 2022-08-08 NOTE — Progress Notes (Unsigned)
Established patient visit   Patient: Anna Ortega   DOB: 06-03-1981   41 y.o. Female  MRN: [id number] Visit Date: 08/10/2022  Today's healthcare provider: Jacky Kindle, FNP  Re Introduced to nurse practitioner role and practice setting.  All questions answered.  Discussed provider/patient relationship and expectations.   I,Meredeth Furber J Keishawn Darsey,acting as a scribe for Jacky Kindle, FNP.,have documented all relevant documentation on the behalf of Jacky Kindle, FNP,as directed by  Jacky Kindle, FNP while in the presence of Jacky Kindle, FNP.   Chief Complaint  Patient presents with   Hyperlipidemia   Hypertension   Subjective    HPI  Lipid/Cholesterol, Follow-up  Last lipid panel Other pertinent labs  Lab Results  Component Value Date   CHOL 166 09/15/2021   HDL 55 09/15/2021   LDLCALC 87 09/15/2021   TRIG 135 09/15/2021   CHOLHDL 3.0 09/15/2021   Lab Results  Component Value Date   ALT 20 03/05/2019   AST 15 03/05/2019   PLT 341 03/05/2019   TSH 3.09 02/12/2020     She was last seen for this 11 months ago.  Management since that visit includes no changes.  She reports excellent compliance with treatment. She is not having side effects.   Symptoms: No chest pain No chest pressure/discomfort  No dyspnea No lower extremity edema  No numbness or tingling of extremity No orthopnea  No palpitations No paroxysmal nocturnal dyspnea  No speech difficulty No syncope   Current diet: well balanced Current exercise: walking  The 10-year ASCVD risk score (Arnett DK, et al., 2019) is: 0.7%  Hypertension, follow-up  BP Readings from Last 3 Encounters:  08/10/22 132/82  09/15/21 (!) 143/71  12/24/20 (!) 151/72   Wt Readings from Last 3 Encounters:  08/10/22 176 lb (79.8 kg)  09/15/21 175 lb 3.2 oz (79.5 kg)  12/24/20 170 lb 6.4 oz (77.3 kg)     She was last seen for hypertension 11 months ago.  BP at that visit was 143/71. Management since that visit  includes no changes.  She reports good compliance with treatment. She is having side effects. HCTC made her dizzy She is following a Regular diet. She is not exercising. She does not smoke.  Use of agents associated with hypertension: none.   Outside blood pressures are around 130/70. Symptoms: No chest pain No chest pressure  No palpitations No syncope  No dyspnea No orthopnea  No paroxysmal nocturnal dyspnea No lower extremity edema   Pertinent labs Lab Results  Component Value Date   CHOL 166 09/15/2021   HDL 55 09/15/2021   LDLCALC 87 09/15/2021   TRIG 135 09/15/2021   CHOLHDL 3.0 09/15/2021   Lab Results  Component Value Date   NA 137 03/05/2019   K 4.1 03/05/2019   CREATININE 0.91 03/05/2019   GFRNONAA 80 03/05/2019   GLUCOSE 91 03/05/2019   TSH 3.09 02/12/2020     The 10-year ASCVD risk score (Arnett DK, et al., 2019) is: 0.7%  ---------------------------------------------------------------------------------------------------  ---------------------------------------------------------------------------------------------------   Medications: Outpatient Medications Prior to Visit  Medication Sig   MAGNESIUM PO Take by mouth.   Vitamin D, Ergocalciferol, (DRISDOL) 1.25 MG (50000 UNIT) CAPS capsule Take 50,000 Units by mouth every 7 (seven) days.   [DISCONTINUED] amLODipine (NORVASC) 5 MG tablet Take 1 tablet (5 mg total) by mouth daily.   [DISCONTINUED] atorvastatin (LIPITOR) 40 MG tablet Take 1 tablet (40 mg total) by mouth daily.   [  DISCONTINUED] baclofen (LIORESAL) 10 MG tablet Take 1 tablet (10 mg total) by mouth 3 (three) times daily.   [DISCONTINUED] carvedilol (COREG) 12.5 MG tablet Take 1 tablet (12.5 mg total) by mouth 2 (two) times daily with a meal.   [DISCONTINUED] hydrochlorothiazide (MICROZIDE) 12.5 MG capsule Take 1 capsule (12.5 mg total) by mouth daily.   [DISCONTINUED] labetalol (NORMODYNE) 200 MG tablet 1 tablet Orally Twice a day for 30 day(s)    [DISCONTINUED] losartan (COZAAR) 50 MG tablet Take 1 tablet (50 mg total) by mouth daily.   No facility-administered medications prior to visit.    Review of Systems    Objective    BP 132/82 (BP Location: Left Arm, Patient Position: Sitting, Cuff Size: Large)   Pulse 79   Resp 16   Ht 4\' 11"  (1.499 m)   Wt 176 lb (79.8 kg)   SpO2 99%   BMI 35.55 kg/m   Physical Exam Vitals and nursing note reviewed.  Constitutional:      General: She is not in acute distress.    Appearance: Normal appearance. She is obese. She is not ill-appearing, toxic-appearing or diaphoretic.  HENT:     Head: Normocephalic and atraumatic.  Cardiovascular:     Rate and Rhythm: Normal rate and regular rhythm.     Pulses: Normal pulses.     Heart sounds: Normal heart sounds. No murmur heard.    No friction rub. No gallop.  Pulmonary:     Effort: Pulmonary effort is normal. No respiratory distress.     Breath sounds: Normal breath sounds. No stridor. No wheezing, rhonchi or rales.  Chest:     Chest wall: No tenderness.  Musculoskeletal:        General: No swelling, tenderness, deformity or signs of injury. Normal range of motion.     Right lower leg: No edema.     Left lower leg: No edema.  Skin:    General: Skin is warm and dry.     Capillary Refill: Capillary refill takes less than 2 seconds.     Coloration: Skin is not jaundiced or pale.     Findings: No bruising, erythema, lesion or rash.  Neurological:     General: No focal deficit present.     Mental Status: She is alert and oriented to person, place, and time. Mental status is at baseline.     Cranial Nerves: No cranial nerve deficit.     Sensory: No sensory deficit.     Motor: No weakness.     Coordination: Coordination normal.  Psychiatric:        Mood and Affect: Mood normal.        Behavior: Behavior normal.        Thought Content: Thought content normal.        Judgment: Judgment normal.      No results found for any visits  on 08/10/22.  Assessment & Plan     Problem List Items Addressed This Visit       Cardiovascular and Mediastinum   Hypertension    Chronic, stable Goal <140/<90 Repeat CBC and CMP Currently controlled with Norvasc 5 mg, Coreg 12.5 mg BID and Losartan 50 mg; unable to tolerate low dose, 12.5 HCTZ d/t dizziness       Relevant Medications   amLODipine (NORVASC) 5 MG tablet   atorvastatin (LIPITOR) 40 MG tablet   carvedilol (COREG) 12.5 MG tablet   losartan (COZAAR) 50 MG tablet   Other Relevant Orders  CBC with Differential/Platelet   Comprehensive Metabolic Panel (CMET)     Other   Chronic bilateral low back pain without sciatica    Chronic, stable Medication refill requested      Relevant Medications   baclofen (LIORESAL) 10 MG tablet   Hyperlipidemia    Chronic, previously stable with LDL <100 with use of lipitor 40 mg Repeat LP recommend diet low in saturated fat and regular exercise - 30 min at least 5 times per week       Relevant Medications   amLODipine (NORVASC) 5 MG tablet   atorvastatin (LIPITOR) 40 MG tablet   carvedilol (COREG) 12.5 MG tablet   losartan (COZAAR) 50 MG tablet   Other Relevant Orders   Lipid panel     Return in about 1 year (around 08/11/2023) for annual examination.      Vonna Kotyk, FNP, have reviewed all documentation for this visit. The documentation on 08/10/22 for the exam, diagnosis, procedures, and orders are all accurate and complete.    Gwyneth Sprout, [place] [phone] (phone) [phone] (fax)  [place]

## 2022-08-10 ENCOUNTER — Encounter: Payer: Self-pay | Admitting: Family Medicine

## 2022-08-10 ENCOUNTER — Ambulatory Visit (INDEPENDENT_AMBULATORY_CARE_PROVIDER_SITE_OTHER): Payer: No Typology Code available for payment source | Admitting: Family Medicine

## 2022-08-10 DIAGNOSIS — G8929 Other chronic pain: Secondary | ICD-10-CM | POA: Diagnosis not present

## 2022-08-10 DIAGNOSIS — M545 Low back pain, unspecified: Secondary | ICD-10-CM | POA: Diagnosis not present

## 2022-08-10 DIAGNOSIS — I10 Essential (primary) hypertension: Secondary | ICD-10-CM

## 2022-08-10 DIAGNOSIS — E785 Hyperlipidemia, unspecified: Secondary | ICD-10-CM

## 2022-08-10 MED ORDER — LOSARTAN POTASSIUM 50 MG PO TABS: 50.0000 mg | ORAL_TABLET | Freq: Every day | ORAL | 3 refills | Status: DC

## 2022-08-10 MED ORDER — ATORVASTATIN CALCIUM 40 MG PO TABS: 40.0000 mg | ORAL_TABLET | Freq: Every day | ORAL | 3 refills | Status: DC

## 2022-08-10 MED ORDER — CARVEDILOL 12.5 MG PO TABS: 12.5000 mg | ORAL_TABLET | Freq: Two times a day (BID) | ORAL | 3 refills | Status: DC

## 2022-08-10 MED ORDER — AMLODIPINE BESYLATE 5 MG PO TABS: 5.0000 mg | ORAL_TABLET | Freq: Every day | ORAL | 3 refills | Status: DC

## 2022-08-10 MED ORDER — BACLOFEN 10 MG PO TABS: 10.0000 mg | ORAL_TABLET | Freq: Three times a day (TID) | ORAL | 0 refills | Status: DC

## 2022-08-10 NOTE — Assessment & Plan Note (Signed)
Chronic, previously stable with LDL <100 with use of lipitor 40 mg Repeat LP recommend diet low in saturated fat and regular exercise - 30 min at least 5 times per week

## 2022-08-10 NOTE — Assessment & Plan Note (Signed)
Chronic, stable Medication refill requested

## 2022-08-10 NOTE — Assessment & Plan Note (Signed)
Chronic, stable Goal <140/<90 Repeat CBC and CMP Currently controlled with Norvasc 5 mg, Coreg 12.5 mg BID and Losartan 50 mg; unable to tolerate low dose, 12.5 HCTZ d/t dizziness

## 2022-08-11 LAB — LIPID PANEL
Chol/HDL Ratio: 3 ratio (ref 0.0–4.4)
Cholesterol, Total: 163 mg/dL (ref 100–199)
HDL: 55 mg/dL (ref >39)
LDL Chol Calc (NIH): 74 mg/dL (ref 0–99)
Triglycerides: 204 mg/dL — ABNORMAL HIGH (ref 0–149)
VLDL Cholesterol Cal: 34 mg/dL (ref 5–40)

## 2022-08-11 LAB — COMPREHENSIVE METABOLIC PANEL
ALT: 14 IU/L (ref 0–32)
AST: 18 IU/L (ref 0–40)
Albumin/Globulin Ratio: 1.8 (ref 1.2–2.2)
Albumin: 4.9 g/dL (ref 3.9–4.9)
Alkaline Phosphatase: 120 IU/L (ref 44–121)
BUN/Creatinine Ratio: 17 (ref 9–23)
BUN: 13 mg/dL (ref 6–24)
Bilirubin Total: 0.7 mg/dL (ref 0.0–1.2)
CO2: 22 mmol/L (ref 20–29)
Calcium: 9.6 mg/dL (ref 8.7–10.2)
Chloride: 104 mmol/L (ref 96–106)
Creatinine, Ser: 0.75 mg/dL (ref 0.57–1.00)
Globulin, Total: 2.8 g/dL (ref 1.5–4.5)
Glucose: 80 mg/dL (ref 70–99)
Potassium: 4 mmol/L (ref 3.5–5.2)
Sodium: 142 mmol/L (ref 134–144)
Total Protein: 7.7 g/dL (ref 6.0–8.5)
eGFR: 102 mL/min/{1.73_m2} (ref >59)

## 2022-08-11 LAB — CBC WITH DIFFERENTIAL/PLATELET
Basophils Absolute: 0.1 10*3/uL (ref 0.0–0.2)
Basos: 1 %
EOS (ABSOLUTE): 0.3 10*3/uL (ref 0.0–0.4)
Eos: 3 %
Hematocrit: 38.7 % (ref 34.0–46.6)
Hemoglobin: 13.2 g/dL (ref 11.1–15.9)
Immature Grans (Abs): 0 10*3/uL (ref 0.0–0.1)
Immature Granulocytes: 0 %
Lymphocytes Absolute: 3.4 10*3/uL — ABNORMAL HIGH (ref 0.7–3.1)
Lymphs: 41 %
MCH: 30.1 pg (ref 26.6–33.0)
MCHC: 34.1 g/dL (ref 31.5–35.7)
MCV: 88 fL (ref 79–97)
Monocytes Absolute: 0.7 10*3/uL (ref 0.1–0.9)
Monocytes: 9 %
Neutrophils Absolute: 3.8 10*3/uL (ref 1.4–7.0)
Neutrophils: 46 %
Platelets: 309 10*3/uL (ref 150–450)
RBC: 4.38 x10E6/uL (ref 3.77–5.28)
RDW: 12.7 % (ref 11.7–15.4)
WBC: 8.3 10*3/uL (ref 3.4–10.8)

## 2022-08-11 NOTE — Progress Notes (Signed)
Labs have been reviewed and are normal and/or stable.  Please let us know if you have any questions.  Thank you, Arieona Swaggerty T Kaydee Magel, FNP  [facility] [address] [place], [place] [address] [phone] (phone) [phone] (fax) [facility] 

## 2023-01-26 LAB — RESULTS CONSOLE HPV: CHL HPV: NEGATIVE

## 2023-01-26 LAB — OB RESULTS CONSOLE GC/CHLAMYDIA: Chlamydia: NEGATIVE

## 2023-01-26 LAB — HM PAP SMEAR: HM Pap smear: NEGATIVE

## 2023-01-27 ENCOUNTER — Other Ambulatory Visit: Payer: Self-pay | Admitting: Obstetrics

## 2023-01-27 DIAGNOSIS — Z1231 Encounter for screening mammogram for malignant neoplasm of breast: Secondary | ICD-10-CM

## 2023-02-10 ENCOUNTER — Ambulatory Visit
Admission: RE | Admit: 2023-02-10 | Discharge: 2023-02-10 | Disposition: A | Discharge status: Home / Self Care | Payer: Commercial Managed Care - PPO | Source: Ambulatory Visit | Attending: Obstetrics | Admitting: Obstetrics

## 2023-02-10 DIAGNOSIS — Z1231 Encounter for screening mammogram for malignant neoplasm of breast: Secondary | ICD-10-CM | POA: Diagnosis present

## 2023-02-24 ENCOUNTER — Other Ambulatory Visit: Payer: Self-pay | Admitting: Certified Nurse Midwife

## 2023-02-24 DIAGNOSIS — R928 Other abnormal and inconclusive findings on diagnostic imaging of breast: Secondary | ICD-10-CM

## 2023-02-24 DIAGNOSIS — N6489 Other specified disorders of breast: Secondary | ICD-10-CM

## 2023-03-29 ENCOUNTER — Ambulatory Visit
Admission: RE | Admit: 2023-03-29 | Discharge: 2023-03-29 | Disposition: A | Discharge status: Home / Self Care | Payer: Commercial Managed Care - PPO | Source: Ambulatory Visit | Attending: Certified Nurse Midwife | Admitting: Certified Nurse Midwife

## 2023-03-29 DIAGNOSIS — R928 Other abnormal and inconclusive findings on diagnostic imaging of breast: Secondary | ICD-10-CM | POA: Diagnosis present

## 2023-03-29 DIAGNOSIS — N6489 Other specified disorders of breast: Secondary | ICD-10-CM | POA: Diagnosis present

## 2023-08-12 ENCOUNTER — Other Ambulatory Visit: Payer: Self-pay | Admitting: Family Medicine

## 2023-08-12 DIAGNOSIS — E785 Hyperlipidemia, unspecified: Secondary | ICD-10-CM

## 2023-10-13 DIAGNOSIS — I671 Cerebral aneurysm, nonruptured: Secondary | ICD-10-CM | POA: Insufficient documentation

## 2023-11-09 ENCOUNTER — Other Ambulatory Visit: Payer: Self-pay | Admitting: Family Medicine

## 2023-11-09 DIAGNOSIS — E785 Hyperlipidemia, unspecified: Secondary | ICD-10-CM

## 2023-12-14 ENCOUNTER — Other Ambulatory Visit: Payer: Self-pay

## 2023-12-14 ENCOUNTER — Telehealth: Payer: Self-pay | Admitting: Family Medicine

## 2023-12-14 DIAGNOSIS — I10 Essential (primary) hypertension: Secondary | ICD-10-CM

## 2023-12-14 NOTE — Telephone Encounter (Signed)
 [facility] pharmacy requesting refill amLODipine  (NORVASC ) 5 MG tablet  Please advise

## 2023-12-14 NOTE — Telephone Encounter (Signed)
 [facility] pharmacy is requesting refill losartan  (COZAAR ) 50 MG tablet  Please advise

## 2023-12-21 ENCOUNTER — Ambulatory Visit: Payer: Commercial Managed Care - PPO | Admitting: Family Medicine

## 2023-12-21 ENCOUNTER — Encounter: Payer: Self-pay | Admitting: Family Medicine

## 2023-12-21 VITALS — BP 135/75 | HR 63 | Resp 16 | Ht 60.0 in | Wt 184.9 lb

## 2023-12-21 DIAGNOSIS — Z6836 Body mass index (BMI) 36.0-36.9, adult: Secondary | ICD-10-CM

## 2023-12-21 DIAGNOSIS — E785 Hyperlipidemia, unspecified: Secondary | ICD-10-CM | POA: Diagnosis not present

## 2023-12-21 DIAGNOSIS — I10 Essential (primary) hypertension: Secondary | ICD-10-CM | POA: Diagnosis not present

## 2023-12-21 MED ORDER — ATORVASTATIN CALCIUM 40 MG PO TABS: ORAL_TABLET | ORAL | 1 refills | Status: DC

## 2023-12-21 MED ORDER — LOSARTAN POTASSIUM 50 MG PO TABS: 50.0000 mg | ORAL_TABLET | Freq: Every day | ORAL | 1 refills | Status: DC

## 2023-12-21 MED ORDER — CARVEDILOL 12.5 MG PO TABS: 12.5000 mg | ORAL_TABLET | Freq: Two times a day (BID) | ORAL | 1 refills | Status: DC

## 2023-12-21 MED ORDER — AMLODIPINE BESYLATE 5 MG PO TABS: 5.0000 mg | ORAL_TABLET | Freq: Every day | ORAL | 3 refills | Status: DC

## 2023-12-21 NOTE — Progress Notes (Signed)
Established patient visit   Patient: Anna Ortega   DOB: 04/15/1981   43 y.o. Female  MRN: [id number] Visit Date: 12/21/2023  Today's healthcare provider: Ronnald Ramp, MD   Chief Complaint  Patient presents with   Medical Management of Chronic Issues    HTN, cholesterol follow-up. Patient need refills.   Subjective     HPI     Medical Management of Chronic Issues    Additional comments: HTN, cholesterol follow-up. Patient need refills.      Last edited by Marjie Skiff, CMA on 12/21/2023  2:16 PM.       Discussed the use of AI scribe software for clinical note transcription with the patient, who gave verbal consent to proceed.  History of Present Illness   Anna Ortega is a 43 year old female with hypertension and hypercholesterolemia who presents for follow-up.  She is here for a follow-up regarding her hypertension, which is currently well controlled. She is taking amlodipine 5 mg daily, carvedilol 12.5 mg twice daily, and losartan 50 mg daily for blood pressure management.  For hypercholesterolemia, she is on atorvastatin 40 mg daily. A lipid panel will be collected to assess her cholesterol levels.  She has concerns about weight gain, which she attributes to a decrease in physical activity since being moved to a more sedentary role at work in September of the previous year. Her weight has increased from 176 pounds in 2023 to 184 pounds currently. She is worried that her lack of activity may be affecting her cholesterol and blood sugar levels.  She has noticed an increase in her morning blood sugar levels, which were previously around 99 mg/dL but have recently risen to between 100 and 120 mg/dL. She is concerned about the potential for developing diabetes and its associated side effects, such as leg cramps and fatigue.         Past Medical History:  Diagnosis Date   Aneurysm (HCC)    above R eye    Hyperlipidemia    Hypertension      Medications: Outpatient Medications Prior to Visit  Medication Sig   baclofen (LIORESAL) 10 MG tablet Take 1 tablet (10 mg total) by mouth 3 (three) times daily.   Coenzyme Q10 (COQ-10 PO) Take by mouth.   MAGNESIUM PO Take by mouth.   [DISCONTINUED] amLODipine (NORVASC) 5 MG tablet Take 1 tablet (5 mg total) by mouth daily.   [DISCONTINUED] atorvastatin (LIPITOR) 40 MG tablet TAKE 1 TABLET (40 MG TOTAL) BY MOUTH DAILY. COURTESY REFILL DUE FOR AN OFFICE APPT.   [DISCONTINUED] carvedilol (COREG) 12.5 MG tablet Take 1 tablet (12.5 mg total) by mouth 2 (two) times daily with a meal.   [DISCONTINUED] losartan (COZAAR) 50 MG tablet Take 1 tablet (50 mg total) by mouth daily.   BIOTIN PO Take by mouth in the morning and at bedtime.   cholecalciferol (VITAMIN D3) 25 MCG (1000 UNIT) tablet Take 1,000 Units by mouth in the morning and at bedtime.   FERROUS SULFATE PO Take by mouth.   [DISCONTINUED] Vitamin D, Ergocalciferol, (DRISDOL) 1.25 MG (50000 UNIT) CAPS capsule Take 50,000 Units by mouth every 7 (seven) days. (Patient not taking: Reported on 12/21/2023)   No facility-administered medications prior to visit.    Review of Systems  Last metabolic panel Lab Results  Component Value Date   GLUCOSE 80 08/10/2022   NA 142 08/10/2022   K 4.0 08/10/2022   CL 104 08/10/2022   CO2 22 08/10/2022  BUN 13 08/10/2022   CREATININE 0.75 08/10/2022   EGFR 102 08/10/2022   CALCIUM 9.6 08/10/2022   PROT 7.7 08/10/2022   ALBUMIN 4.9 08/10/2022   LABGLOB 2.8 08/10/2022   AGRATIO 1.8 08/10/2022   BILITOT 0.7 08/10/2022   ALKPHOS 120 08/10/2022   AST 18 08/10/2022   ALT 14 08/10/2022   Last lipids Lab Results  Component Value Date   CHOL 163 08/10/2022   HDL 55 08/10/2022   LDLCALC 74 08/10/2022   TRIG 204 (H) 08/10/2022   CHOLHDL 3.0 08/10/2022    No results found for: "HGBA1C"     Objective    BP 135/75 (BP Location: Right Arm, Patient Position: Sitting, Cuff Size: Large)    Pulse 63   Resp 16   Ht 5' (1.524 m)   Wt 184 lb 14.4 oz (83.9 kg)   LMP 11/17/2023   SpO2 100%   BMI 36.11 kg/m  BP Readings from Last 3 Encounters:  12/21/23 135/75  08/10/22 132/82  09/15/21 (!) 143/71   Wt Readings from Last 3 Encounters:  12/21/23 184 lb 14.4 oz (83.9 kg)  08/10/22 176 lb (79.8 kg)  09/15/21 175 lb 3.2 oz (79.5 kg)        Physical Exam Vitals reviewed.  Constitutional:      General: She is not in acute distress.    Appearance: Normal appearance. She is not ill-appearing, toxic-appearing or diaphoretic.  Eyes:     Conjunctiva/sclera: Conjunctivae normal.  Cardiovascular:     Rate and Rhythm: Normal rate and regular rhythm.     Pulses: Normal pulses.     Heart sounds: Normal heart sounds. No murmur heard.    No friction rub. No gallop.  Pulmonary:     Effort: Pulmonary effort is normal. No respiratory distress.     Breath sounds: Normal breath sounds. No stridor. No wheezing, rhonchi or rales.  Abdominal:     General: Bowel sounds are normal. There is no distension.     Palpations: Abdomen is soft.     Tenderness: There is no abdominal tenderness.  Musculoskeletal:     Right lower leg: No edema.     Left lower leg: No edema.  Skin:    Findings: No erythema or rash.  Neurological:     Mental Status: She is alert and oriented to person, place, and time.  Psychiatric:        Mood and Affect: Mood and affect normal.        Speech: Speech normal.        Behavior: Behavior normal. Behavior is cooperative.       Results for orders placed or performed in visit on 12/21/23  OB RESULTS CONSOLE GC/Chlamydia  Result Value Ref Range   Chlamydia Negative   HM PAP SMEAR  Result Value Ref Range   HM Pap smear Negative   Results Console HPV  Result Value Ref Range   CHL HPV Negative     Assessment & Plan     Problem List Items Addressed This Visit       Cardiovascular and Mediastinum   Hypertension - Primary   Relevant Medications    amLODipine (NORVASC) 5 MG tablet   atorvastatin (LIPITOR) 40 MG tablet   carvedilol (COREG) 12.5 MG tablet   losartan (COZAAR) 50 MG tablet   Other Relevant Orders   Comprehensive metabolic panel   Hemoglobin A1c     Other   Hyperlipidemia   Relevant Medications   amLODipine (NORVASC) 5  MG tablet   atorvastatin (LIPITOR) 40 MG tablet   carvedilol (COREG) 12.5 MG tablet   losartan (COZAAR) 50 MG tablet   Other Relevant Orders   Hemoglobin A1c   Other Visit Diagnoses       BMI 36.0-36.9,adult       Relevant Orders   Comprehensive metabolic panel   Lipid panel   Hemoglobin A1c           Weight Gain Reports weight gain from 176 lbs to 184 lbs since September due to decreased physical activity at work. Discussed the importance of incorporating small, frequent bouts of exercise and dietary changes to manage weight and improve overall health. - Incorporate short, frequent exercises such as squats or walking laps at work - Reduce intake of high-carbohydrate foods such as rice, pasta, fried foods, and bread  Hyperglycemia Expresses concern about rising blood sugar levels, with recent readings increasing from 99 mg/dL to 295 mg/dL. Discussed potential for diabetes and the importance of monitoring blood sugar levels. Prefers to avoid diabetes medications due to potential side effects such as leg cramps and fatigue. - Order hemoglobin A1c to assess average blood glucose levels over the past three months  Hypercholesterolemia On atorvastatin 40 mg daily. Concern about potential increase in cholesterol levels due to recent weight gain and decreased physical activity. Discussed the importance of diet and exercise in managing cholesterol levels. - Order lipid panel - Continue atorvastatin 40 mg daily - Refill atorvastatin  Hypertension Hypertension is well-controlled with current medications. Blood pressure reading today is 135/75 mmHg. On amlodipine 5 mg daily, carvedilol 12.5 mg  twice daily, and losartan 50 mg daily. - Continue amlodipine 5 mg daily - Continue carvedilol 12.5 mg twice daily - Continue losartan 50 mg daily - Refill all medications  General Health Maintenance Due for a physical exam in August. - Schedule physical exam in August  Follow-up - Review lab results and communicate via MyChart message - Schedule follow-up appointment if lab results indicate the need for changes in treatment.         Return in about 6 months (around 06/19/2024) for CPE.         Ronnald Ramp, MD  [facility] [phone] (phone) [phone] (fax)  [facility]

## 2023-12-22 ENCOUNTER — Encounter: Payer: Self-pay | Admitting: Family Medicine

## 2023-12-22 LAB — COMPREHENSIVE METABOLIC PANEL
ALT: 26 [IU]/L (ref 0–32)
AST: 21 [IU]/L (ref 0–40)
Albumin: 4.7 g/dL (ref 3.9–4.9)
Alkaline Phosphatase: 114 [IU]/L (ref 44–121)
BUN/Creatinine Ratio: 15 (ref 9–23)
BUN: 12 mg/dL (ref 6–24)
Bilirubin Total: 0.5 mg/dL (ref 0.0–1.2)
CO2: 23 mmol/L (ref 20–29)
Calcium: 10.1 mg/dL (ref 8.7–10.2)
Chloride: 101 mmol/L (ref 96–106)
Creatinine, Ser: 0.79 mg/dL (ref 0.57–1.00)
Globulin, Total: 3 g/dL (ref 1.5–4.5)
Glucose: 83 mg/dL (ref 70–99)
Potassium: 4.1 mmol/L (ref 3.5–5.2)
Sodium: 140 mmol/L (ref 134–144)
Total Protein: 7.7 g/dL (ref 6.0–8.5)
eGFR: 95 mL/min/{1.73_m2} (ref >59)

## 2023-12-22 LAB — LIPID PANEL
Chol/HDL Ratio: 3.7 {ratio} (ref 0.0–4.4)
Cholesterol, Total: 186 mg/dL (ref 100–199)
HDL: 50 mg/dL (ref >39)
LDL Chol Calc (NIH): 98 mg/dL (ref 0–99)
Triglycerides: 227 mg/dL — ABNORMAL HIGH (ref 0–149)
VLDL Cholesterol Cal: 38 mg/dL (ref 5–40)

## 2023-12-22 LAB — HEMOGLOBIN A1C
Est. average glucose Bld gHb Est-mCnc: 146 mg/dL
Hgb A1c MFr Bld: 6.7 % — ABNORMAL HIGH (ref 4.8–5.6)

## 2023-12-26 DIAGNOSIS — E781 Pure hyperglyceridemia: Secondary | ICD-10-CM

## 2023-12-27 ENCOUNTER — Other Ambulatory Visit: Payer: Self-pay

## 2023-12-27 MED ORDER — FENOFIBRATE 48 MG PO TABS: 48.0000 mg | ORAL_TABLET | Freq: Every day | ORAL | 1 refills | Status: DC

## 2024-04-11 ENCOUNTER — Telehealth: Payer: Self-pay

## 2024-04-11 NOTE — Telephone Encounter (Signed)
 Please advise to stop amlodipine  due to SE of edema and continue losartan  and carvedilol 

## 2024-04-11 NOTE — Telephone Encounter (Signed)
 Copied from CRM [id number]. Topic: Appointments - Appointment Scheduling >> Apr 11, 2024 11:12 AM Emylou G wrote: Patient called.. one of her meds is causing ankle to swell .. Maybe: amLODipine  (NORVASC ) 5 MG tablet .Aaron Aas Pls call patient on what to do.. maybe change the med

## 2024-04-11 NOTE — Telephone Encounter (Signed)
 Patient called back. Relayed doctor's advise. Pt says she has no further questions at this time.

## 2024-04-11 NOTE — Telephone Encounter (Signed)
LVMCTB.

## 2024-06-19 ENCOUNTER — Encounter: Payer: Self-pay | Admitting: Family Medicine

## 2024-06-19 ENCOUNTER — Ambulatory Visit (INDEPENDENT_AMBULATORY_CARE_PROVIDER_SITE_OTHER): Payer: BC Managed Care – PPO | Admitting: Family Medicine

## 2024-06-19 VITALS — BP 132/83 | HR 73 | Resp 16 | Ht 59.0 in | Wt 187.0 lb

## 2024-06-19 DIAGNOSIS — I10 Essential (primary) hypertension: Secondary | ICD-10-CM

## 2024-06-19 DIAGNOSIS — Z1159 Encounter for screening for other viral diseases: Secondary | ICD-10-CM | POA: Diagnosis not present

## 2024-06-19 DIAGNOSIS — E785 Hyperlipidemia, unspecified: Secondary | ICD-10-CM

## 2024-06-19 DIAGNOSIS — R7309 Other abnormal glucose: Secondary | ICD-10-CM

## 2024-06-19 DIAGNOSIS — Z130 Encounter for screening for diseases of the blood and blood-forming organs and certain disorders involving the immune mechanism: Secondary | ICD-10-CM

## 2024-06-19 DIAGNOSIS — Z0000 Encounter for general adult medical examination without abnormal findings: Secondary | ICD-10-CM | POA: Insufficient documentation

## 2024-06-19 NOTE — Progress Notes (Signed)
 Complete physical exam   Patient: Anna Ortega   DOB: 09/02/1981   43 y.o. Female  MRN: [id number] Visit Date: 06/19/2024  Today's healthcare provider: Rockie Agent, MD   Chief Complaint  Patient presents with   Annual Exam    CPE. Pt wants to discuss BP meds   Subjective    Anna Ortega is a 43 y.o. female who presents today for a complete physical exam.    She does not have additional problems to discuss today.   Discussed the use of AI scribe software for clinical note transcription with the patient, who gave verbal consent to proceed.  History of Present Illness Anna Ortega is a 43 year old female who presents for an annual physical exam.  She has a history of hypertension, previously managed with amlodipine , which she discontinued due to swelling.  She has hyperlipidemia and is currently taking atorvastatin  and fenofibrate  for cholesterol management. She plans to return for fasting labs to reassess her cholesterol levels.  Her last hemoglobin A1c was 6.7. She is managing her condition with diet and exercise, walking for 30 minutes daily and following a general diet. She is concerned about starting diabetes medication due to potential side effects.     Past Medical History:  Diagnosis Date   Aneurysm (HCC)    above R eye    Hyperlipidemia    Hypertension    History reviewed. No pertinent surgical history. Social History   Socioeconomic History   Marital status: Married    Spouse name: Not on file   Number of children: Not on file   Years of education: Not on file   Highest education level: Not on file  Occupational History   Not on file  Tobacco Use   Smoking status: Never   Smokeless tobacco: Never  Vaping Use   Vaping status: Never Used  Substance and Sexual Activity   Alcohol use: Never   Drug use: Never   Sexual activity: Yes  Other Topics Concern   Not on file  Social History Narrative   Not on file   Social  Drivers of Health   Financial Resource Strain: Not on file  Food Insecurity: Not on file  Transportation Needs: Not on file  Physical Activity: Not on file  Stress: Not on file  Social Connections: Not on file  Intimate Partner Violence: Not on file   Family Status  Relation Name Status   Mother  Alive   Father  Alive   Bruna Nyhan  (Not Specified)  No partnership data on file   Family History  Problem Relation Age of Onset   Hypertension Mother    Glaucoma Mother    Hypertension Father    Thyroid disease Father    Breast cancer Paternal Aunt    Allergies  Allergen Reactions   Gadolinium Derivatives Rash     Medications: Outpatient Medications Prior to Visit  Medication Sig Note   atorvastatin  (LIPITOR) 40 MG tablet Take (1) 40mg  tablet by mouth once daily    baclofen  (LIORESAL ) 10 MG tablet Take 1 tablet (10 mg total) by mouth 3 (three) times daily.    BIOTIN PO Take by mouth in the morning and at bedtime.    carvedilol  (COREG ) 12.5 MG tablet Take 1 tablet (12.5 mg total) by mouth 2 (two) times daily with a meal.    cholecalciferol (VITAMIN D3) 25 MCG (1000 UNIT) tablet Take 1,000 Units by mouth in the morning and at bedtime.  Coenzyme Q10 (COQ-10 PO) Take by mouth.    fenofibrate  (TRICOR ) 48 MG tablet Take 1 tablet (48 mg total) by mouth daily.    FERROUS SULFATE PO Take by mouth.    losartan  (COZAAR ) 50 MG tablet Take 1 tablet (50 mg total) by mouth daily.    MAGNESIUM PO Take by mouth.    [DISCONTINUED] amLODipine  (NORVASC ) 5 MG tablet Take 1 tablet (5 mg total) by mouth daily. (Patient not taking: Reported on 06/19/2024) 06/19/2024: swelling   No facility-administered medications prior to visit.    Review of Systems  Last CBC Lab Results  Component Value Date   WBC 8.3 08/10/2022   HGB 13.2 08/10/2022   HCT 38.7 08/10/2022   MCV 88 08/10/2022   MCH 30.1 08/10/2022   RDW 12.7 08/10/2022   PLT 309 08/10/2022   Last metabolic panel Lab Results  Component  Value Date   GLUCOSE 83 12/21/2023   NA 140 12/21/2023   K 4.1 12/21/2023   CL 101 12/21/2023   CO2 23 12/21/2023   BUN 12 12/21/2023   CREATININE 0.79 12/21/2023   EGFR 95 12/21/2023   CALCIUM  10.1 12/21/2023   PROT 7.7 12/21/2023   ALBUMIN 4.7 12/21/2023   LABGLOB 3.0 12/21/2023   AGRATIO 1.8 08/10/2022   BILITOT 0.5 12/21/2023   ALKPHOS 114 12/21/2023   AST 21 12/21/2023   ALT 26 12/21/2023   Last lipids Lab Results  Component Value Date   CHOL 186 12/21/2023   HDL 50 12/21/2023   LDLCALC 98 12/21/2023   TRIG 227 (H) 12/21/2023   CHOLHDL 3.7 12/21/2023   The 10-year ASCVD risk score (Arnett DK, et al., 2019) is: 1.1%  Last hemoglobin A1c Lab Results  Component Value Date   HGBA1C 6.7 (H) 12/21/2023   Last thyroid functions Lab Results  Component Value Date   TSH 3.09 02/12/2020   Last vitamin D No results found for: 25OHVITD2, 25OHVITD3, VD25OH Last vitamin B12 and Folate No results found for: VITAMINB12, FOLATE     Objective    BP 132/83 (BP Location: Right Arm, Patient Position: Sitting)   Pulse 73   Resp 16   Ht 4' 11 (1.499 m)   Wt 187 lb (84.8 kg)   SpO2 99%   BMI 37.77 kg/m   BP Readings from Last 3 Encounters:  06/19/24 132/83  12/21/23 135/75  08/10/22 132/82   Wt Readings from Last 3 Encounters:  06/19/24 187 lb (84.8 kg)  12/21/23 184 lb 14.4 oz (83.9 kg)  08/10/22 176 lb (79.8 kg)        Physical Exam Vitals reviewed.  Constitutional:      General: She is not in acute distress.    Appearance: Normal appearance. She is not ill-appearing, toxic-appearing or diaphoretic.  HENT:     Head: Normocephalic and atraumatic.     Right Ear: Tympanic membrane and external ear normal. There is no impacted cerumen.     Left Ear: Tympanic membrane and external ear normal. There is no impacted cerumen.     Nose: Nose normal.     Mouth/Throat:     Pharynx: Oropharynx is clear.  Eyes:     General: No scleral icterus.     Extraocular Movements: Extraocular movements intact.     Conjunctiva/sclera: Conjunctivae normal.     Pupils: Pupils are equal, round, and reactive to light.  Cardiovascular:     Rate and Rhythm: Normal rate and regular rhythm.     Pulses: Normal pulses.  Heart sounds: Normal heart sounds. No murmur heard.    No friction rub. No gallop.  Pulmonary:     Effort: Pulmonary effort is normal. No respiratory distress.     Breath sounds: Normal breath sounds. No wheezing, rhonchi or rales.  Abdominal:     General: Bowel sounds are normal. There is no distension.     Palpations: Abdomen is soft. There is no mass.     Tenderness: There is no abdominal tenderness. There is no guarding.  Musculoskeletal:        General: No deformity.     Cervical back: Normal range of motion and neck supple.     Right lower leg: No edema.     Left lower leg: No edema.  Lymphadenopathy:     Cervical: No cervical adenopathy.  Skin:    General: Skin is warm.     Capillary Refill: Capillary refill takes less than 2 seconds.     Findings: No erythema or rash.  Neurological:     General: No focal deficit present.     Mental Status: She is alert and oriented to person, place, and time.     Cranial Nerves: Cranial nerves 2-12 are intact. No cranial nerve deficit or facial asymmetry.     Motor: Motor function is intact. No weakness.     Gait: Gait normal.  Psychiatric:        Mood and Affect: Mood normal.        Behavior: Behavior normal.       Last depression screening scores    06/19/2024    3:55 PM 12/21/2023    2:20 PM 08/10/2022    3:30 PM  PHQ 2/9 Scores  PHQ - 2 Score 0 0 0  PHQ- 9 Score 0  0    Last fall risk screening    12/21/2023    2:20 PM  Fall Risk   Falls in the past year? 0  Number falls in past yr: 0  Injury with Fall? 0  Risk for fall due to : No Fall Risks    Last Audit-C alcohol use screening    08/10/2022    3:31 PM  Alcohol Use Disorder Test (AUDIT)  1. How often do  you have a drink containing alcohol? 0  2. How many drinks containing alcohol do you have on a typical day when you are drinking? 0  3. How often do you have six or more drinks on one occasion? 0  AUDIT-C Score 0   A score of 3 or more in women, and 4 or more in men indicates increased risk for alcohol abuse, EXCEPT if all of the points are from question 1   No results found for any visits on 06/19/24.  Assessment & Plan    Routine Health Maintenance and Physical Exam  Immunization History  Administered Date(s) Administered   Influenza-Unspecified 07/05/2018   MMR 07/05/2018   Tdap 07/05/2018    Health Maintenance  Topic Date Due   Hepatitis C Screening  Never done   Hepatitis B Vaccines 19-59 Average Risk (1 of 3 - 19+ 3-dose series) Never done   HPV VACCINES (1 - 3-dose SCDM series) Never done   COVID-19 Vaccine (1 - 2024-25 season) Never done   INFLUENZA VACCINE  06/07/2024   Cervical Cancer Screening (HPV/Pap Cotest)  01/26/2028   DTaP/Tdap/Td (2 - Td or Tdap) 07/05/2028   HIV Screening  Completed   Pneumococcal Vaccine  Aged Out   Meningococcal B Vaccine  Aged Out    Problem List Items Addressed This Visit       Cardiovascular and Mediastinum   Hypertension   Relevant Orders   CMP14+EGFR     Other   Hyperlipidemia   Relevant Orders   Lipid panel   Annual physical exam - Primary   Other Visit Diagnoses       Encounter for hepatitis C screening test for low risk patient         Screening for deficiency anemia       Relevant Orders   CBC     Elevated hemoglobin A1c       Relevant Orders   Hemoglobin A1c       Assessment and Plan Assessment & Plan Adult Wellness Visit Annual physical examination conducted. Engages in 30 minutes of walking daily. No special diet followed. Blood pressure is well-controlled. - Order lipid panel, A1c, CBC, and CMP - Advise fasting for 8 hours before lab tests - Discuss vaccination options including influenza, hepatitis  B, and HPV vaccines - Schedule follow-up in 3 months  Type 2 diabetes mellitus A1c was 6.7%, indicating diabetes. Current diabetes is well-controlled with A1c under 7%. - Order A1c test - Discuss medication options including metformin  and GLP-1 receptor agonists like Ozempic, Rybelsus, and Mounjaro, considering concerns about metformin  side effects and cost of alternatives - Review lab results to determine need for medication  Essential hypertension Blood pressure is well-controlled. Discontinued amlodipine  due to swelling side effects. No current antihypertensive medication prescribed.  Hyperlipidemia Currently taking atorvastatin  (Lipitor) and fenofibrate  (Tricor ) for hyperlipidemia. Continued management necessary due to diabetes risk. - Order lipid panel to assess current status       Return in about 3 months (around 09/19/2024) for DM, HTN.       Rockie Agent, MD  [facility] [phone] (phone) [phone] (fax)  [facility]

## 2024-06-19 NOTE — Patient Instructions (Addendum)
 Diabetes Meds  -Mounjaro  -ozempic  -metformin  It was a pleasure to see you today!  Thank you for choosing [facility] for your primary care.   Today you were seen for your annual physical  Please review the attached information regarding helpful preventive health topics.   To keep you healthy, please keep in mind the following health maintenance items that you are due for:   Health Maintenance Due  Topic Date Due   Hepatitis C Screening  Never done   Hepatitis B Vaccines (1 of 3 - 19+ 3-dose series) Never done   HPV VACCINES (1 - 3-dose SCDM series) Never done   COVID-19 Vaccine (1 - 2024-25 season) Never done   INFLUENZA VACCINE  06/07/2024     Best Wishes,   Dr. Lang

## 2024-06-20 ENCOUNTER — Encounter: Payer: Self-pay | Admitting: Obstetrics

## 2024-06-22 LAB — CMP14+EGFR
ALT: 41 IU/L — ABNORMAL HIGH (ref 0–32)
AST: 29 IU/L (ref 0–40)
Albumin: 4.8 g/dL (ref 3.9–4.9)
Alkaline Phosphatase: 103 IU/L (ref 44–121)
BUN/Creatinine Ratio: 13 (ref 9–23)
BUN: 12 mg/dL (ref 6–24)
Bilirubin Total: 0.6 mg/dL (ref 0.0–1.2)
CO2: 20 mmol/L (ref 20–29)
Calcium: 9.7 mg/dL (ref 8.7–10.2)
Chloride: 102 mmol/L (ref 96–106)
Creatinine, Ser: 0.92 mg/dL (ref 0.57–1.00)
Globulin, Total: 2.8 g/dL (ref 1.5–4.5)
Glucose: 92 mg/dL (ref 70–99)
Potassium: 4 mmol/L (ref 3.5–5.2)
Sodium: 140 mmol/L (ref 134–144)
Total Protein: 7.6 g/dL (ref 6.0–8.5)
eGFR: 79 mL/min/1.73 (ref >59)

## 2024-06-22 LAB — LIPID PANEL
Chol/HDL Ratio: 3.5 ratio (ref 0.0–4.4)
Cholesterol, Total: 159 mg/dL (ref 100–199)
HDL: 45 mg/dL (ref >39)
LDL Chol Calc (NIH): 90 mg/dL (ref 0–99)
Triglycerides: 135 mg/dL (ref 0–149)
VLDL Cholesterol Cal: 24 mg/dL (ref 5–40)

## 2024-06-22 LAB — CBC
Hematocrit: 42.8 % (ref 34.0–46.6)
Hemoglobin: 13.5 g/dL (ref 11.1–15.9)
MCH: 29.7 pg (ref 26.6–33.0)
MCHC: 31.5 g/dL (ref 31.5–35.7)
MCV: 94 fL (ref 79–97)
Platelets: 312 x10E3/uL (ref 150–450)
RBC: 4.55 x10E6/uL (ref 3.77–5.28)
RDW: 12.3 % (ref 11.7–15.4)
WBC: 8.1 x10E3/uL (ref 3.4–10.8)

## 2024-06-22 LAB — HEMOGLOBIN A1C
Est. average glucose Bld gHb Est-mCnc: 186 mg/dL
Hgb A1c MFr Bld: 8.1 % — ABNORMAL HIGH (ref 4.8–5.6)

## 2024-06-23 ENCOUNTER — Other Ambulatory Visit: Payer: Self-pay | Admitting: Family Medicine

## 2024-06-23 DIAGNOSIS — I10 Essential (primary) hypertension: Secondary | ICD-10-CM

## 2024-06-24 ENCOUNTER — Ambulatory Visit: Payer: Self-pay | Admitting: Family Medicine

## 2024-06-24 ENCOUNTER — Encounter: Payer: Self-pay | Admitting: Family Medicine

## 2024-06-24 DIAGNOSIS — E1165 Type 2 diabetes mellitus with hyperglycemia: Secondary | ICD-10-CM | POA: Insufficient documentation

## 2024-06-24 MED ORDER — METFORMIN HCL ER 500 MG PO TB24
500.0000 mg | ORAL_TABLET | Freq: Every day | ORAL | 2 refills | Status: DC
Start: 2024-06-24 — End: 2024-09-04

## 2024-06-25 ENCOUNTER — Other Ambulatory Visit: Payer: Self-pay

## 2024-06-25 ENCOUNTER — Telehealth: Payer: Self-pay | Admitting: Family Medicine

## 2024-06-25 NOTE — Progress Notes (Signed)
 Please review previous encounter. Left message to cb for a 6wk f/u appt. Seen by patient Anna Ortega on 06/24/2024 11:55 AM.

## 2024-06-25 NOTE — Telephone Encounter (Signed)
 Called pt no answer, lvm to call back to schedule her 6wk f/u. Pt has reviewed results through MyChart Seen by patient Anna Ortega on 06/24/2024 11:55 AM

## 2024-06-25 NOTE — Telephone Encounter (Signed)
 Copied from CRM [id number]. Topic: General - Other >> Jun 25, 2024 12:43 PM Zebedee SAUNDERS wrote: Reason for CRM: Pt called but hung up. Please call [phone] pt to provided lab results.   Desanto, Elena D, CMA to [facility] Nurse 06/25/24 10:41 AM Left message for patient to return call.  Please have E2C2 Triage nurse give patient message and make appointment.  Metformin  has been sent to her pharmacy on file  Sharma Coyer, MD 06/24/24  7:34 AM Result Note Hemoglobin A1c has increased from 6.7 to 8.1, this is in uncontrolled diabetes range. Goal is to keep A1c under 7. - I recommend starting metformin  500mg  daily, we can consider starting Ozempic or Mounjaro weekly injections at next follow up visit. Please schedule patient for 6 week follow up with me.   Liver enzyme is mildly elevated at 41, other electrolytes and kidney function were within normal limits   Cholesterol goal for LDL is less than 55, current level is 90 - please continue atorvastatin  and limit red meats, fried/fatty foods, aim for 30 mins of exercise 5 days per week

## 2024-06-28 NOTE — Telephone Encounter (Signed)
 Did patient have SE from taking the medications or does she not prefer to take the medication?   I would recommend stopping if she has had SE from taking the meds. We can discuss other options when I see her in Oct

## 2024-06-28 NOTE — Telephone Encounter (Signed)
 Copied from CRM [id number]. Topic: Clinical - Prescription Issue >> Jun 28, 2024 11:16 AM Ivette P wrote: Reason for CRM: Pt said she was prescribed medication from labs and said she did not want to take medication due to side effects, would like another medication called CAL and was put on hold  and pt disconnected, pls reach out to pt

## 2024-08-07 ENCOUNTER — Other Ambulatory Visit: Payer: Self-pay | Admitting: Obstetrics

## 2024-08-07 DIAGNOSIS — Z1231 Encounter for screening mammogram for malignant neoplasm of breast: Secondary | ICD-10-CM

## 2024-08-18 ENCOUNTER — Other Ambulatory Visit: Payer: Self-pay | Admitting: Family Medicine

## 2024-08-18 DIAGNOSIS — E785 Hyperlipidemia, unspecified: Secondary | ICD-10-CM

## 2024-09-02 ENCOUNTER — Ambulatory Visit
Admission: RE | Admit: 2024-09-02 | Discharge: 2024-09-02 | Disposition: A | Discharge status: Home / Self Care | Source: Ambulatory Visit | Attending: Obstetrics | Admitting: Obstetrics

## 2024-09-02 ENCOUNTER — Ambulatory Visit: Admitting: Family Medicine

## 2024-09-02 DIAGNOSIS — Z1231 Encounter for screening mammogram for malignant neoplasm of breast: Secondary | ICD-10-CM | POA: Diagnosis present

## 2024-09-04 ENCOUNTER — Ambulatory Visit (INDEPENDENT_AMBULATORY_CARE_PROVIDER_SITE_OTHER)

## 2024-09-04 VITALS — BP 109/56 | HR 72 | Resp 16 | Ht 59.0 in | Wt 180.0 lb

## 2024-09-04 DIAGNOSIS — E1165 Type 2 diabetes mellitus with hyperglycemia: Secondary | ICD-10-CM | POA: Diagnosis not present

## 2024-09-04 DIAGNOSIS — E785 Hyperlipidemia, unspecified: Secondary | ICD-10-CM

## 2024-09-04 DIAGNOSIS — Z7984 Long term (current) use of oral hypoglycemic drugs: Secondary | ICD-10-CM | POA: Diagnosis not present

## 2024-09-04 MED ORDER — TIRZEPATIDE 2.5 MG/0.5ML ~~LOC~~ SOAJ: 2.5000 mg | SUBCUTANEOUS | 0 refills | Status: DC

## 2024-09-04 MED ORDER — TIRZEPATIDE 7.5 MG/0.5ML ~~LOC~~ SOAJ: 7.5000 mg | SUBCUTANEOUS | 0 refills | Status: DC

## 2024-09-04 MED ORDER — TIRZEPATIDE 5 MG/0.5ML ~~LOC~~ SOAJ: 5.0000 mg | SUBCUTANEOUS | 0 refills | Status: DC

## 2024-09-04 NOTE — Progress Notes (Signed)
 Established patient visit   Patient: Anna Ortega   DOB: [birth year]   43 y.o. Female  MRN: [id number] Visit Date: 09/04/2024  Today's healthcare provider: Isaiah DELENA Pepper, MD   Chief Complaint  Patient presents with   Follow-up    6 wk f/u ( No other concerns)   Subjective    HPI  Discussed the use of AI scribe software for clinical note transcription with the patient, who gave verbal consent to proceed.  History of Present Illness Anna Ortega is a 43 year old female with diabetes who presents for follow-up regarding her diabetes management.  She was previously prescribed metformin  for her diabetes management but discontinued it after one and a half weeks due to dizziness, which impaired her ability to drive safely in the mornings. She has not taken any other diabetes medications since stopping metformin .  She is considering injectable GLP-1 medications such as Ozempic or Mounjaro but is concerned about the cost and potential side effects. She has heard about their effectiveness for weight loss and diabetes control but is worried about weight gain after discontinuation.  She is currently taking atorvastatin  and a fibrate for cholesterol management. She has no personal history of pancreatitis or family history of thyroid cancer, although her father had a thyroid issue that was not cancerous.  No numbness, tingling, or ulcers on her feet. She recently had an eye checkup in July. She has not had a urine test for kidney function recently.   Medications: Outpatient Medications Prior to Visit  Medication Sig   atorvastatin  (LIPITOR) 40 MG tablet TAKE 1 TABLET BY MOUTH EVERY DAY   baclofen  (LIORESAL ) 10 MG tablet Take 1 tablet (10 mg total) by mouth 3 (three) times daily.   BIOTIN PO Take by mouth in the morning and at bedtime.   carvedilol  (COREG ) 12.5 MG tablet TAKE 1 TABLET (12.5MG  TOTAL) BY MOUTH TWICE A DAY WITH MEALS   cholecalciferol (VITAMIN D3) 25 MCG (1000  UNIT) tablet Take 1,000 Units by mouth in the morning and at bedtime.   Coenzyme Q10 (COQ-10 PO) Take by mouth.   fenofibrate  (TRICOR ) 48 MG tablet Take 1 tablet (48 mg total) by mouth daily.   FERROUS SULFATE PO Take by mouth.   losartan  (COZAAR ) 50 MG tablet TAKE 1 TABLET BY MOUTH EVERY DAY   MAGNESIUM PO Take by mouth.   [DISCONTINUED] metFORMIN  (GLUCOPHAGE -XR) 500 MG 24 hr tablet Take 1 tablet (500 mg total) by mouth daily with breakfast. (Patient not taking: Reported on 09/04/2024)   No facility-administered medications prior to visit.    Review of Systems as noted in HPI.      Objective    BP (!) 109/56 (BP Location: Left Arm, Patient Position: Sitting)   Pulse 72   Resp 16   Ht 4' 11 (1.499 m)   Wt 180 lb (81.6 kg)   LMP 08/19/2024   SpO2 100%   BMI 36.36 kg/m    Physical Exam Constitutional:      Appearance: Normal appearance.  HENT:     Head: Normocephalic and atraumatic.     Mouth/Throat:     Mouth: Mucous membranes are moist.  Eyes:     Pupils: Pupils are equal, round, and reactive to light.  Pulmonary:     Effort: Pulmonary effort is normal.  Skin:    General: Skin is warm.  Neurological:     General: No focal deficit present.     Mental Status: She is alert.  No results found for any visits on 09/04/24.  Assessment & Plan     Problem List Items Addressed This Visit       Endocrine   Type 2 diabetes mellitus with hyperglycemia, without long-term current use of insulin (HCC) - Primary   Relevant Medications   tirzepatide (MOUNJARO) 2.5 MG/0.5ML Pen   tirzepatide (MOUNJARO) 5 MG/0.5ML Pen   tirzepatide (MOUNJARO) 7.5 MG/0.5ML Pen   Other Relevant Orders   Microalbumin / creatinine urine ratio     Other   Hyperlipidemia   Assessment & Plan Type 2 diabetes mellitus with hyperglycemia Chronic, uncontrolled. Last A1c of 8.1. Metformin  discontinued due to side effects. Mounjaro considered suitable due to efficacy and safety profile. No  contraindications present. - Prescribe Mounjaro, start 2.5 mg weekly, increase monthly to 5 mg, then 7.5 mg as tolerated. Side effects discussed with patient in detail - Perform urine test for kidney function. - Follow up in three months to evaluate response.  Hyperlipidemia Chronic, controlled. Managed with atorvastatin  and fenofibrate . - Continue atorvastatin . - Continue fenofibrate .      Return in about 3 months (around 12/05/2024) for Follow Up Diabetes, weight.       Isaiah DELENA Pepper, MD  [facility] [phone] (phone) [phone] (fax)

## 2024-09-04 NOTE — Patient Instructions (Addendum)
 I value your feedback, so if you receive a survey, please take the time to fill it out. Thank you for choosing [facility]!

## 2024-09-05 LAB — MICROALBUMIN / CREATININE URINE RATIO
Creatinine, Urine: 65.3 mg/dL
Microalb/Creat Ratio: 19 mg/g{creat} (ref 0–29)
Microalbumin, Urine: 12.5 ug/mL

## 2024-09-05 LAB — SPECIMEN STATUS REPORT

## 2024-09-19 ENCOUNTER — Ambulatory Visit: Admitting: Family Medicine

## 2024-09-23 ENCOUNTER — Other Ambulatory Visit: Payer: Self-pay | Admitting: Family Medicine

## 2024-09-23 DIAGNOSIS — E781 Pure hyperglyceridemia: Secondary | ICD-10-CM

## 2024-09-30 ENCOUNTER — Ambulatory Visit
Admission: RE | Admit: 2024-09-30 | Discharge: 2024-09-30 | Disposition: A | Discharge status: Home / Self Care | Source: Ambulatory Visit | Attending: Nurse Practitioner | Admitting: Nurse Practitioner

## 2024-09-30 ENCOUNTER — Telehealth: Admitting: Emergency Medicine

## 2024-09-30 VITALS — BP 162/88 | HR 78 | Temp 98.1°F | Resp 18

## 2024-09-30 DIAGNOSIS — R300 Dysuria: Secondary | ICD-10-CM | POA: Insufficient documentation

## 2024-09-30 DIAGNOSIS — R8290 Unspecified abnormal findings in urine: Secondary | ICD-10-CM | POA: Diagnosis present

## 2024-09-30 DIAGNOSIS — R3989 Other symptoms and signs involving the genitourinary system: Secondary | ICD-10-CM

## 2024-09-30 LAB — POCT URINE DIPSTICK
Bilirubin, UA: NEGATIVE
Glucose, UA: NEGATIVE mg/dL
Ketones, POC UA: NEGATIVE mg/dL
Nitrite, UA: NEGATIVE
POC PROTEIN,UA: NEGATIVE
Spec Grav, UA: 1.02 (ref 1.010–1.025)
Urobilinogen, UA: 2 U/dL — AB
pH, UA: 6.5 (ref 5.0–8.0)

## 2024-09-30 MED ORDER — PHENAZOPYRIDINE HCL 200 MG PO TABS: 200.0000 mg | ORAL_TABLET | Freq: Three times a day (TID) | ORAL | 0 refills | Status: AC

## 2024-09-30 MED ORDER — CEPHALEXIN 500 MG PO CAPS: 500.0000 mg | ORAL_CAPSULE | Freq: Two times a day (BID) | ORAL | 0 refills | Status: AC

## 2024-09-30 NOTE — Discharge Instructions (Addendum)
 Antibiotics have been ordered to treat a possible UTI Urine culture has been collected and sent to further evaluate Pyridium  has been sent to help with the discomfort Increase your oral hydration.

## 2024-09-30 NOTE — ED Provider Notes (Signed)
 [facility]    CSN: [id number] Arrival date & time: 09/30/24  8177      History   Chief Complaint Chief Complaint  Patient presents with   APPT - UTI    HPI Anna Ortega is a 43 y.o. female.   Patient presents for evaluation of dysuria, lower abdominal discomfort, and low back pain that started last night.  She took 2 naproxen for pain relief.  She has also noted blood in her urine.  No abnormal vaginal bleeding or discharge.  No fever, vomiting, or diarrhea.  The discomfort kept her awake last night.  The history is provided by the patient.    Past Medical History:  Diagnosis Date   Aneurysm    above R eye    Hyperlipidemia    Hypertension     Patient Active Problem List   Diagnosis Date Noted   Type 2 diabetes mellitus with hyperglycemia, without long-term current use of insulin (HCC) 06/24/2024   Annual physical exam 06/19/2024   Intracranial aneurysm 10/13/2023   Chronic bilateral low back pain without sciatica 09/15/2021   Hypertension 02/01/2019   Hyperlipidemia 02/01/2019    History reviewed. No pertinent surgical history.  OB History   No obstetric history on file.      Home Medications    Prior to Admission medications   Medication Sig Start Date End Date Taking? Authorizing Provider  cephALEXin  (KEFLEX ) 500 MG capsule Take 1 capsule (500 mg total) by mouth 2 (two) times daily for 5 days. 09/30/24 10/05/24 Yes Janet Therisa PARAS, FNP  phenazopyridine  (PYRIDIUM ) 200 MG tablet Take 1 tablet (200 mg total) by mouth 3 (three) times daily for 2 days. 09/30/24 10/02/24 Yes Janet Therisa PARAS, FNP  atorvastatin  (LIPITOR) 40 MG tablet TAKE 1 TABLET BY MOUTH EVERY DAY 08/19/24   Simmons-Robinson, Rockie, MD  baclofen  (LIORESAL ) 10 MG tablet Take 1 tablet (10 mg total) by mouth 3 (three) times daily. 08/10/22   Emilio Kelly DASEN, FNP  BIOTIN PO Take by mouth in the morning and at bedtime.    [provider]  carvedilol  (COREG ) 12.5 MG tablet TAKE  1 TABLET (12.5MG  TOTAL) BY MOUTH TWICE A DAY WITH MEALS 06/24/24   Simmons-Robinson, Makiera, MD  cholecalciferol (VITAMIN D3) 25 MCG (1000 UNIT) tablet Take 1,000 Units by mouth in the morning and at bedtime.    [provider]  Coenzyme Q10 (COQ-10 PO) Take by mouth.    [provider]  fenofibrate  (TRICOR ) 48 MG tablet TAKE 1 TABLET BY MOUTH EVERY DAY 09/23/24   Simmons-Robinson, Rockie, MD  FERROUS SULFATE PO Take by mouth.    [provider]  losartan  (COZAAR ) 50 MG tablet TAKE 1 TABLET BY MOUTH EVERY DAY 06/24/24   Simmons-Robinson, Rockie, MD  MAGNESIUM PO Take by mouth.    [provider]  tirzepatide  (MOUNJARO ) 2.5 MG/0.5ML Pen Inject 2.5 mg into the skin once a week. 09/04/24   Franchot Isaiah LABOR, MD  tirzepatide  (MOUNJARO ) 5 MG/0.5ML Pen Inject 5 mg into the skin once a week. Start after finishing 2.5 dose. 09/04/24   Franchot Isaiah LABOR, MD  tirzepatide  (MOUNJARO ) 7.5 MG/0.5ML Pen Inject 7.5 mg into the skin once a week. Start after finishing 5mg  dose. 09/04/24   Franchot Isaiah LABOR, MD  fexofenadine  (ALLEGRA  ALLERGY) 180 MG tablet Take 1 tablet (180 mg total) by mouth daily. 08/21/19 11/24/20  Pollak, Adriana M, PA-C  fluticasone  (FLONASE ) 50 MCG/ACT nasal spray Place 2 sprays into both nostrils daily. 08/21/19  11/24/20  Ashok Kathrine HERO, PA-C  NIFEdipine  (PROCARDIA -XL/NIFEDICAL-XL) 30 MG 24 hr tablet TAKE 1 TABLET BY MOUTH EVERY DAY 11/14/19 11/24/20  Ashok Kathrine HERO, PA-C    Family History Family History  Problem Relation Age of Onset   Hypertension Mother    Glaucoma Mother    Hypertension Father    Thyroid disease Father    Breast cancer Paternal Aunt     Social History Social History   Tobacco Use   Smoking status: Never   Smokeless tobacco: Never  Vaping Use   Vaping status: Never Used  Substance Use Topics   Alcohol use: Never   Drug use: Never     Allergies   Gadolinium derivatives   Review of Systems Review of Systems   Constitutional:  Negative for chills and fever.  HENT:  Negative for congestion, rhinorrhea and sore throat.   Eyes:  Negative for pain and redness.  Respiratory:  Negative for cough and shortness of breath.   Cardiovascular:  Negative for chest pain.  Gastrointestinal:  Positive for abdominal pain. Negative for diarrhea and vomiting.  Genitourinary:  Positive for dysuria and hematuria. Negative for vaginal bleeding and vaginal discharge.  Musculoskeletal:  Positive for back pain. Negative for myalgias.  Skin:  Negative for rash.  Neurological:  Negative for dizziness and headaches.  Psychiatric/Behavioral:  Positive for sleep disturbance.      Physical Exam Triage Vital Signs ED Triage Vitals  Encounter Vitals Group     BP 09/30/24 1910 (!) 162/88     Girls Systolic BP Percentile --      Girls Diastolic BP Percentile --      Boys Systolic BP Percentile --      Boys Diastolic BP Percentile --      Pulse Rate 09/30/24 1910 78     Resp 09/30/24 1910 18     Temp 09/30/24 1910 98.1 F (36.7 C)     Temp Source 09/30/24 1910 Oral     SpO2 --      Weight --      Height --      Head Circumference --      Peak Flow --      Pain Score 09/30/24 1908 0     Pain Loc --      Pain Education --      Exclude from Growth Chart --    No data found.  Updated Vital Signs BP (!) 162/88 (BP Location: Right Arm)   Pulse 78   Temp 98.1 F (36.7 C) (Oral)   Resp 18   LMP 09/16/2024 (Exact Date)   Physical Exam Vitals and nursing note reviewed.  Constitutional:      Appearance: Normal appearance.  Cardiovascular:     Rate and Rhythm: Normal rate and regular rhythm.     Heart sounds: Normal heart sounds.  Pulmonary:     Effort: Pulmonary effort is normal.     Breath sounds: Normal breath sounds. No wheezing, rhonchi or rales.  Abdominal:     General: Bowel sounds are normal.  Genitourinary:    Comments: Mild right CVA tenderness. Skin:    General: Skin is warm and dry.   Neurological:     General: No focal deficit present.     Mental Status: She is alert and oriented to person, place, and time.  Psychiatric:        Mood and Affect: Mood normal.        Behavior: Behavior normal.  Thought Content: Thought content normal.        Judgment: Judgment normal.      UC Treatments / Results  Labs (all labs ordered are listed, but only abnormal results are displayed) Labs Reviewed  POCT URINE DIPSTICK - Abnormal; Notable for the following components:      Result Value   Blood, UA trace-lysed (*)    Urobilinogen, UA 2.0 (*)    Leukocytes, UA Small (1+) (*)    All other components within normal limits    EKG   Radiology No results found.  Procedures Procedures (including critical care time)  Medications Ordered in UC Medications - No data to display  Initial Impression / Assessment and Plan / UC Course  I have reviewed the triage vital signs and the nursing notes.  Pertinent labs & imaging results that were available during my care of the patient were reviewed by me and considered in my medical decision making (see chart for details).    Patient presents requesting evaluation of the new onset of dysuria and hematuria that she noted last night.  Also with mild lower abdominal discomfort and right sided low back pain.  POCT UA demonstrated trace blood and a small amount of leukocytes.  Reasonable to prescribe antibiotic therapy while awaiting urine culture result.  I have also provided Pyridium  to aid in symptom alleviation.  Encouraged increased oral hydration.  A work note has been provided.  Final Clinical Impressions(s) / UC Diagnoses   Final diagnoses:  Dysuria  Abnormal urine findings     Discharge Instructions      Antibiotics have been ordered to treat a possible UTI Urine culture has been collected and sent to further evaluate Pyridium  has been sent to help with the discomfort Increase your oral hydration.     ED  Prescriptions     Medication Sig Dispense Auth. Provider   cephALEXin  (KEFLEX ) 500 MG capsule Take 1 capsule (500 mg total) by mouth 2 (two) times daily for 5 days. 10 capsule Janet Therisa PARAS, FNP   phenazopyridine  (PYRIDIUM ) 200 MG tablet Take 1 tablet (200 mg total) by mouth 3 (three) times daily for 2 days. 6 tablet Janet Therisa PARAS, FNP      PDMP not reviewed this encounter.   Janet Therisa PARAS, FNP 09/30/24 [id number]

## 2024-09-30 NOTE — Progress Notes (Signed)
  Because you have back pain, belly pain, and blood in your urine, I am worried you could have a more complicated urinary tract infection, and I think you need urine testing. I feel your condition warrants further evaluation and I recommend that you be seen in a face-to-face visit.   NOTE: There will be NO CHARGE for this E-Visit   If you are having a true medical emergency, please call 911.     For an urgent face to face visit, [place] has multiple urgent care centers for your convenience.  Click the link below for the full list of locations and hours, walk-in wait times, appointment scheduling options and driving directions:  Urgent Care - [place], [place], [place], [place], [place], [place]  [address]     Your MyChart E-visit questionnaire answers were reviewed by a board certified advanced clinical practitioner to complete your personal care plan based on your specific symptoms.    Thank you for using e-Visits.

## 2024-09-30 NOTE — ED Triage Notes (Signed)
 Pt c/o burning on urination with blood in urine. C/o urgency and frequency since last night.

## 2024-10-02 ENCOUNTER — Ambulatory Visit: Payer: Self-pay

## 2024-10-02 LAB — URINE CULTURE: Culture: 40000 — AB

## 2024-11-13 ENCOUNTER — Other Ambulatory Visit: Payer: Self-pay | Admitting: Neurology

## 2024-11-13 DIAGNOSIS — I729 Aneurysm of unspecified site: Secondary | ICD-10-CM

## 2024-11-24 ENCOUNTER — Ambulatory Visit
Admission: RE | Admit: 2024-11-24 | Discharge: 2024-11-24 | Disposition: A | Discharge status: Home / Self Care | Source: Ambulatory Visit | Attending: Neurology | Admitting: Neurology

## 2024-11-24 DIAGNOSIS — I729 Aneurysm of unspecified site: Secondary | ICD-10-CM | POA: Insufficient documentation

## 2024-12-05 ENCOUNTER — Ambulatory Visit: Admitting: Family Medicine

## 2024-12-05 VITALS — BP 140/72 | HR 76 | Ht 59.0 in | Wt 183.6 lb

## 2024-12-05 DIAGNOSIS — Z1211 Encounter for screening for malignant neoplasm of colon: Secondary | ICD-10-CM

## 2024-12-05 DIAGNOSIS — E785 Hyperlipidemia, unspecified: Secondary | ICD-10-CM | POA: Diagnosis not present

## 2024-12-05 DIAGNOSIS — E1169 Type 2 diabetes mellitus with other specified complication: Secondary | ICD-10-CM

## 2024-12-05 DIAGNOSIS — I10 Essential (primary) hypertension: Secondary | ICD-10-CM | POA: Diagnosis not present

## 2024-12-05 DIAGNOSIS — I152 Hypertension secondary to endocrine disorders: Secondary | ICD-10-CM | POA: Diagnosis not present

## 2024-12-05 DIAGNOSIS — Z6836 Body mass index (BMI) 36.0-36.9, adult: Secondary | ICD-10-CM | POA: Diagnosis not present

## 2024-12-05 DIAGNOSIS — Z7985 Long-term (current) use of injectable non-insulin antidiabetic drugs: Secondary | ICD-10-CM

## 2024-12-05 DIAGNOSIS — Z1159 Encounter for screening for other viral diseases: Secondary | ICD-10-CM

## 2024-12-05 DIAGNOSIS — E1159 Type 2 diabetes mellitus with other circulatory complications: Secondary | ICD-10-CM

## 2024-12-05 DIAGNOSIS — E1165 Type 2 diabetes mellitus with hyperglycemia: Secondary | ICD-10-CM | POA: Diagnosis not present

## 2024-12-05 DIAGNOSIS — M545 Low back pain, unspecified: Secondary | ICD-10-CM

## 2024-12-05 DIAGNOSIS — G8929 Other chronic pain: Secondary | ICD-10-CM

## 2024-12-05 DIAGNOSIS — Z789 Other specified health status: Secondary | ICD-10-CM

## 2024-12-05 MED ORDER — RYBELSUS 7 MG PO TABS: 7.0000 mg | ORAL_TABLET | Freq: Every day | ORAL | 3 refills | Status: AC

## 2024-12-05 MED ORDER — RYBELSUS 3 MG PO TABS: 3.0000 mg | ORAL_TABLET | Freq: Every day | ORAL | Status: AC

## 2024-12-05 MED ORDER — BACLOFEN 10 MG PO TABS: 10.0000 mg | ORAL_TABLET | Freq: Three times a day (TID) | ORAL | 2 refills | Status: AC

## 2024-12-05 MED ORDER — RYBELSUS 3 MG PO TABS: 3.0000 mg | ORAL_TABLET | Freq: Every day | ORAL | 0 refills | Status: DC

## 2024-12-05 MED ORDER — LOSARTAN POTASSIUM 100 MG PO TABS: 100.0000 mg | ORAL_TABLET | Freq: Every day | ORAL | 2 refills | Status: AC

## 2024-12-05 NOTE — Progress Notes (Signed)
 "  Established Patient Office Visit  Patient ID: Anna Ortega, female    DOB: 07-28-1981  Age: 44 y.o. MRN: [id number] PCP: Sharma Coyer, MD  Chief Complaint  Patient presents with   Medical Management of Chronic Issues    Patient is present for f/u DM and weight mgmt, patient did not start mounjaro     Subjective:     HPI  Discussed the use of AI scribe software for clinical note transcription with the patient, who gave verbal consent to proceed.  History of Present Illness Anna Ortega is a 44 year old female with chronic hypertension who presents for a follow-up visit.  She has been experiencing elevated blood pressure readings at home, ranging from 130/140 to 150 mmHg. Her diet, particularly after consuming foods like pizza, can influence these readings. Stress and lack of sleep, related to family issues, may also contribute to her elevated blood pressure. Her current antihypertensive regimen includes carvedilol  12.5 mg twice daily and losartan  50 mg daily, which she tolerates well. She previously tried amlodipine , which caused leg swelling, and hydrochlorothiazide , which made her dizzy and increased her blood sugar.  She is managing hyperlipidemia and has been prescribed Mounjaro  to titrate from 2.5 mg to 7.5 mg weekly, but has not started it due to personal circumstances, including moving houses and family stress. She tried metformin  for a week, which caused diarrhea.  Her mother-in-law's health issues have been a significant source of stress, impacting her sleep and potentially her blood pressure. The mother-in-law was recently hospitalized for severe arthritis and is awaiting placement for rehabilitation.   Patient Active Problem List   Diagnosis Date Noted   Type 2 diabetes mellitus with hyperglycemia, without long-term current use of insulin (HCC) 06/24/2024   Annual physical exam 06/19/2024   Intracranial aneurysm 10/13/2023   Chronic bilateral low  back pain without sciatica 09/15/2021   Hypertension associated with diabetes (HCC) 02/01/2019   Hyperlipidemia associated with type 2 diabetes mellitus (HCC) 02/01/2019      ROS    Objective:     BP (!) 140/72 (Patient Position: Sitting, Cuff Size: Normal)   Pulse 76   Ht 4' 11 (1.499 m)   Wt 183 lb 9.6 oz (83.3 kg)   LMP 11/16/2024 (Exact Date)   SpO2 100%   BMI 37.08 kg/m  BP Readings from Last 3 Encounters:  12/05/24 (!) 140/72  09/30/24 (!) 162/88  09/04/24 (!) 109/56   Wt Readings from Last 3 Encounters:  12/05/24 183 lb 9.6 oz (83.3 kg)  09/04/24 180 lb (81.6 kg)  06/19/24 187 lb (84.8 kg)      Physical Exam Vitals reviewed.  Constitutional:      General: She is not in acute distress.    Appearance: Normal appearance. She is not ill-appearing.  Cardiovascular:     Rate and Rhythm: Normal rate and regular rhythm.  Pulmonary:     Effort: Pulmonary effort is normal. No respiratory distress.     Breath sounds: No wheezing, rhonchi or rales.  Neurological:     Mental Status: She is alert and oriented to person, place, and time.  Psychiatric:        Mood and Affect: Mood normal.        Behavior: Behavior normal.      Results for orders placed or performed in visit on 12/05/24  HgB A1c  Result Value Ref Range   Hgb A1c MFr Bld 7.6 (H) 4.8 - 5.6 %   Est. average glucose Bld gHb  Est-mCnc 171 mg/dL  Comprehensive metabolic panel with GFR  Result Value Ref Range   Glucose 87 70 - 99 mg/dL   BUN 11 6 - 24 mg/dL   Creatinine, Ser 9.14 0.57 - 1.00 mg/dL   eGFR 86 >40 fO/fpw/8.26   BUN/Creatinine Ratio 13 9 - 23   Sodium 142 134 - 144 mmol/L   Potassium 3.8 3.5 - 5.2 mmol/L   Chloride 103 96 - 106 mmol/L   CO2 23 20 - 29 mmol/L   Calcium  9.5 8.7 - 10.2 mg/dL   Total Protein 7.4 6.0 - 8.5 g/dL   Albumin 5.0 (H) 3.9 - 4.9 g/dL   Globulin, Total 2.4 1.5 - 4.5 g/dL   Bilirubin Total 0.7 0.0 - 1.2 mg/dL   Alkaline Phosphatase 105 41 - 116 IU/L   AST 25 0 -  40 IU/L   ALT 33 (H) 0 - 32 IU/L  Lipid panel  Result Value Ref Range   Cholesterol, Total 165 100 - 199 mg/dL   Triglycerides 862 0 - 149 mg/dL   HDL 48 >60 mg/dL   VLDL Cholesterol Cal 24 5 - 40 mg/dL   LDL Chol Calc (NIH) 93 0 - 99 mg/dL   Chol/HDL Ratio 3.4 0.0 - 4.4 ratio  Hepatitis B Surface AntiBODY  Result Value Ref Range   Hep B Surface Ab, Qual Non Reactive   Hepatitis C Antibody  Result Value Ref Range   Hep C Virus Ab Non Reactive Non Reactive    Last metabolic panel Lab Results  Component Value Date   GLUCOSE 87 12/05/2024   NA 142 12/05/2024   K 3.8 12/05/2024   CL 103 12/05/2024   CO2 23 12/05/2024   BUN 11 12/05/2024   CREATININE 0.85 12/05/2024   EGFR 86 12/05/2024   CALCIUM  9.5 12/05/2024   PROT 7.4 12/05/2024   ALBUMIN 5.0 (H) 12/05/2024   LABGLOB 2.4 12/05/2024   AGRATIO 1.8 08/10/2022   BILITOT 0.7 12/05/2024   ALKPHOS 105 12/05/2024   AST 25 12/05/2024   ALT 33 (H) 12/05/2024   Last lipids Lab Results  Component Value Date   CHOL 165 12/05/2024   HDL 48 12/05/2024   LDLCALC 93 12/05/2024   TRIG 137 12/05/2024   CHOLHDL 3.4 12/05/2024   Last hemoglobin A1c Lab Results  Component Value Date   HGBA1C 7.6 (H) 12/05/2024   Last thyroid functions Lab Results  Component Value Date   TSH 3.09 02/12/2020   FREET4 1.08 02/12/2020      The 10-year ASCVD risk score (Arnett DK, et al., 2019) is: 2.3%  Outpatient Encounter Medications as of 12/05/2024  Medication Sig   atorvastatin  (LIPITOR) 40 MG tablet TAKE 1 TABLET BY MOUTH EVERY DAY   BIOTIN PO Take by mouth in the morning and at bedtime.   carvedilol  (COREG ) 12.5 MG tablet TAKE 1 TABLET (12.5MG  TOTAL) BY MOUTH TWICE A DAY WITH MEALS   cholecalciferol (VITAMIN D3) 25 MCG (1000 UNIT) tablet Take 1,000 Units by mouth in the morning and at bedtime.   Coenzyme Q10 (COQ-10 PO) Take by mouth.   fenofibrate  (TRICOR ) 48 MG tablet TAKE 1 TABLET BY MOUTH EVERY DAY   FERROUS SULFATE PO Take by  mouth.   MAGNESIUM PO Take by mouth.   Semaglutide  (RYBELSUS ) 3 MG TABS Take 1 tablet (3 mg total) by mouth daily.   [START ON 01/02/2025] Semaglutide  (RYBELSUS ) 7 MG TABS Take 1 tablet (7 mg total) by mouth daily.   [DISCONTINUED] baclofen  (  LIORESAL ) 10 MG tablet Take 1 tablet (10 mg total) by mouth 3 (three) times daily.   [DISCONTINUED] losartan  (COZAAR ) 50 MG tablet TAKE 1 TABLET BY MOUTH EVERY DAY   [DISCONTINUED] Semaglutide  (RYBELSUS ) 3 MG TABS Take 1 tablet (3 mg total) by mouth daily.   baclofen  (LIORESAL ) 10 MG tablet Take 1 tablet (10 mg total) by mouth 3 (three) times daily.   losartan  (COZAAR ) 100 MG tablet Take 1 tablet (100 mg total) by mouth daily.   [DISCONTINUED] fexofenadine  (ALLEGRA  ALLERGY) 180 MG tablet Take 1 tablet (180 mg total) by mouth daily.   [DISCONTINUED] fluticasone  (FLONASE ) 50 MCG/ACT nasal spray Place 2 sprays into both nostrils daily.   [DISCONTINUED] NIFEdipine  (PROCARDIA -XL/NIFEDICAL-XL) 30 MG 24 hr tablet TAKE 1 TABLET BY MOUTH EVERY DAY   [DISCONTINUED] tirzepatide  (MOUNJARO ) 2.5 MG/0.5ML Pen Inject 2.5 mg into the skin once a week. (Patient not taking: Reported on 12/05/2024)   [DISCONTINUED] tirzepatide  (MOUNJARO ) 5 MG/0.5ML Pen Inject 5 mg into the skin once a week. Start after finishing 2.5 dose. (Patient not taking: Reported on 12/05/2024)   [DISCONTINUED] tirzepatide  (MOUNJARO ) 7.5 MG/0.5ML Pen Inject 7.5 mg into the skin once a week. Start after finishing 5mg  dose. (Patient not taking: Reported on 12/05/2024)   No facility-administered encounter medications on file as of 12/05/2024.       Assessment & Plan:   Problem List Items Addressed This Visit     Chronic bilateral low back pain without sciatica   Relevant Medications   baclofen  (LIORESAL ) 10 MG tablet   Hyperlipidemia associated with type 2 diabetes mellitus (HCC)   Chronic condition  LDL was 90 mg/dL in August, with a target of 55 mg/dL. Liver enzymes were slightly elevated with ALT at 41  U/L. - Ordered lipid panel. -continue atorvastatin  40mg  daily       Relevant Medications   losartan  (COZAAR ) 100 MG tablet   Semaglutide  (RYBELSUS ) 3 MG TABS   Semaglutide  (RYBELSUS ) 7 MG TABS (Start on 01/02/2025)   Hypertension associated with diabetes (HCC)   Chronic, associated with DM  Blood pressure is elevated at 155/87 mmHg, with home readings ranging from 130-150 mmHg, influenced by dietary intake. Stress and lack of sleep may contribute to elevated readings. Current regimen includes carvedilol  12.5 mg twice daily and losartan  50 mg daily. Previous intolerance to amlodipine  and hydrochlorothiazide  noted. - Increased losartan  to 100 mg daily. - Continue carvedilol  12.5 mg twice daily. - Monitor blood pressure at home.      Relevant Medications   losartan  (COZAAR ) 100 MG tablet   Semaglutide  (RYBELSUS ) 3 MG TABS   Semaglutide  (RYBELSUS ) 7 MG TABS (Start on 01/02/2025)   Type 2 diabetes mellitus with hyperglycemia, without long-term current use of insulin (HCC) - Primary   Type 2 diabetes mellitus with hyperglycemia Chronic  A1c was 8.1% in August, indicating suboptimal glycemic control. Mounjaro  was prescribed but not started due to personal circumstances. Metformin  was trialed but caused diarrhea. Discussed starting Rybelsus  (oral GLP-1 receptor agonist) as an alternative to Mounjaro , with potential benefits for A1c reduction and weight management. Rybelsus  requires daily dosing, unlike Mounjaro 's weekly dosing. - Started Rybelsus  3 mg daily. - Ordered A1c and metabolic panel. - Provided Rybelsus  3mg  daily,provided samples for one month. - Will increase Rybelsus  to 7 mg daily if tolerated.      Relevant Medications   losartan  (COZAAR ) 100 MG tablet   Semaglutide  (RYBELSUS ) 3 MG TABS   Semaglutide  (RYBELSUS ) 7 MG TABS (Start on 01/02/2025)   Other  Relevant Orders   HgB A1c (Completed)   Comprehensive metabolic panel with GFR (Completed)   Other Visit Diagnoses       BMI  36.0-36.9,adult         Hepatitis B vaccination status unknown       Relevant Orders   Hepatitis B Surface AntiBODY (Completed)     Encounter for hepatitis C screening test for low risk patient       Relevant Orders   Hepatitis C Antibody (Completed)     Colon cancer screening       Relevant Orders   Ambulatory referral to Gastroenterology       Assessment and Plan Assessment & Plan    General Health Maintenance Request for colon cancer screening. Hepatitis B and C status to be assessed. - Referred to gastroenterology for colonoscopy. - Ordered hepatitis B surface antibody and hepatitis C antibody tests.    Return in about 3 months (around 03/05/2025) for DM, HTN, Cholesterol.    Rockie Agent, MD [facility] [place]  "

## 2024-12-05 NOTE — Patient Instructions (Signed)
 To keep you healthy, please keep in mind the following health maintenance items that you are due for:   Health Maintenance Due  Topic Date Due   Hepatitis C Screening  Never done   Hepatitis B Vaccines 19-59 Average Risk (1 of 3 - 19+ 3-dose series) Never done   COVID-19 Vaccine (1 - 2025-26 season) Never done   Colonoscopy  Never done     Best Wishes,   Dr. Lang

## 2024-12-06 ENCOUNTER — Ambulatory Visit: Payer: Self-pay | Admitting: Family Medicine

## 2024-12-06 LAB — COMPREHENSIVE METABOLIC PANEL WITH GFR
ALT: 33 [IU]/L — ABNORMAL HIGH (ref 0–32)
AST: 25 [IU]/L (ref 0–40)
Albumin: 5 g/dL — ABNORMAL HIGH (ref 3.9–4.9)
Alkaline Phosphatase: 105 [IU]/L (ref 41–116)
BUN/Creatinine Ratio: 13 (ref 9–23)
BUN: 11 mg/dL (ref 6–24)
Bilirubin Total: 0.7 mg/dL (ref 0.0–1.2)
CO2: 23 mmol/L (ref 20–29)
Calcium: 9.5 mg/dL (ref 8.7–10.2)
Chloride: 103 mmol/L (ref 96–106)
Creatinine, Ser: 0.85 mg/dL (ref 0.57–1.00)
Globulin, Total: 2.4 g/dL (ref 1.5–4.5)
Glucose: 87 mg/dL (ref 70–99)
Potassium: 3.8 mmol/L (ref 3.5–5.2)
Sodium: 142 mmol/L (ref 134–144)
Total Protein: 7.4 g/dL (ref 6.0–8.5)
eGFR: 86 mL/min/{1.73_m2}

## 2024-12-06 LAB — LIPID PANEL
Chol/HDL Ratio: 3.4 ratio (ref 0.0–4.4)
Cholesterol, Total: 165 mg/dL (ref 100–199)
HDL: 48 mg/dL
LDL Chol Calc (NIH): 93 mg/dL (ref 0–99)
Triglycerides: 137 mg/dL (ref 0–149)
VLDL Cholesterol Cal: 24 mg/dL (ref 5–40)

## 2024-12-06 LAB — HEPATITIS B SURFACE ANTIBODY,QUALITATIVE: Hep B Surface Ab, Qual: NONREACTIVE

## 2024-12-06 LAB — HEPATITIS C ANTIBODY: Hep C Virus Ab: NONREACTIVE

## 2024-12-06 LAB — HEMOGLOBIN A1C
Est. average glucose Bld gHb Est-mCnc: 171 mg/dL
Hgb A1c MFr Bld: 7.6 % — ABNORMAL HIGH (ref 4.8–5.6)

## 2024-12-06 NOTE — Assessment & Plan Note (Signed)
 Chronic, associated with DM  Blood pressure is elevated at 155/87 mmHg, with home readings ranging from 130-150 mmHg, influenced by dietary intake. Stress and lack of sleep may contribute to elevated readings. Current regimen includes carvedilol  12.5 mg twice daily and losartan  50 mg daily. Previous intolerance to amlodipine  and hydrochlorothiazide  noted. - Increased losartan  to 100 mg daily. - Continue carvedilol  12.5 mg twice daily. - Monitor blood pressure at home.

## 2024-12-06 NOTE — Assessment & Plan Note (Signed)
 Chronic condition  LDL was 90 mg/dL in August, with a target of 55 mg/dL. Liver enzymes were slightly elevated with ALT at 41 U/L. - Ordered lipid panel. -continue atorvastatin  40mg  daily

## 2024-12-06 NOTE — Assessment & Plan Note (Signed)
 Type 2 diabetes mellitus with hyperglycemia Chronic  A1c was 8.1% in August, indicating suboptimal glycemic control. Mounjaro  was prescribed but not started due to personal circumstances. Metformin  was trialed but caused diarrhea. Discussed starting Rybelsus  (oral GLP-1 receptor agonist) as an alternative to Mounjaro , with potential benefits for A1c reduction and weight management. Rybelsus  requires daily dosing, unlike Mounjaro 's weekly dosing. - Started Rybelsus  3 mg daily. - Ordered A1c and metabolic panel. - Provided Rybelsus  3mg  daily,provided samples for one month. - Will increase Rybelsus  to 7 mg daily if tolerated.

## 2025-03-10 ENCOUNTER — Ambulatory Visit: Admitting: Family Medicine
